# Patient Record
Sex: Female | Born: 1975 | Hispanic: Yes | Marital: Married | Smoking: Never smoker
Health system: Southern US, Community
[De-identification: ages and names within clinical notes are randomized; demographics above are authoritative.]

## PROBLEM LIST (undated history)

## (undated) DIAGNOSIS — G8929 Other chronic pain: Secondary | ICD-10-CM

## (undated) DIAGNOSIS — F329 Major depressive disorder, single episode, unspecified: Secondary | ICD-10-CM

## (undated) DIAGNOSIS — T7421XA Adult sexual abuse, confirmed, initial encounter: Secondary | ICD-10-CM

## (undated) DIAGNOSIS — F4310 Post-traumatic stress disorder, unspecified: Secondary | ICD-10-CM

## (undated) DIAGNOSIS — F32A Depression, unspecified: Secondary | ICD-10-CM

## (undated) DIAGNOSIS — F319 Bipolar disorder, unspecified: Secondary | ICD-10-CM

## (undated) HISTORY — DX: Other chronic pain: G89.29

---

## 2004-01-08 ENCOUNTER — Inpatient Hospital Stay: Payer: Self-pay | Admitting: Psychiatry

## 2004-01-08 ENCOUNTER — Observation Stay: Payer: Self-pay | Admitting: Internal Medicine

## 2004-01-09 ENCOUNTER — Other Ambulatory Visit: Payer: Self-pay

## 2004-12-05 ENCOUNTER — Emergency Department: Payer: Self-pay | Admitting: General Practice

## 2005-01-06 ENCOUNTER — Ambulatory Visit: Payer: Self-pay | Admitting: Nurse Practitioner

## 2006-12-14 ENCOUNTER — Encounter: Payer: Self-pay | Admitting: Family Medicine

## 2006-12-18 ENCOUNTER — Encounter: Payer: Self-pay | Admitting: Family Medicine

## 2010-02-23 ENCOUNTER — Emergency Department: Payer: Self-pay | Admitting: Emergency Medicine

## 2011-04-28 ENCOUNTER — Emergency Department: Payer: Self-pay | Admitting: Emergency Medicine

## 2011-04-28 LAB — BASIC METABOLIC PANEL
Anion Gap: 10 (ref 7–16)
Co2: 25 mmol/L (ref 21–32)
Creatinine: 0.65 mg/dL (ref 0.60–1.30)
EGFR (African American): >60
EGFR (Non-African Amer.): >60
Sodium: 142 mmol/L (ref 136–145)

## 2011-04-28 LAB — CBC
HGB: 12.6 g/dL (ref 12.0–16.0)
MCHC: 33.5 g/dL (ref 32.0–36.0)
Platelet: 280 10*3/uL (ref 150–440)
RBC: 4.1 10*6/uL (ref 3.80–5.20)

## 2011-04-28 LAB — URINALYSIS, COMPLETE
Bacteria: NONE SEEN
Bilirubin,UR: NEGATIVE
Glucose,UR: NEGATIVE mg/dL (ref 0–75)
Hyaline Cast: 1
Leukocyte Esterase: NEGATIVE
Protein: NEGATIVE
RBC,UR: 1 /HPF (ref 0–5)
Squamous Epithelial: 3
WBC UR: 1 /HPF (ref 0–5)

## 2011-04-28 LAB — LITHIUM LEVEL: Lithium: <0.2 mmol/L — ABNORMAL LOW

## 2011-09-13 ENCOUNTER — Emergency Department: Payer: Self-pay | Admitting: Emergency Medicine

## 2011-09-13 LAB — URINALYSIS, COMPLETE
Bacteria: NONE SEEN
Bilirubin,UR: NEGATIVE
Glucose,UR: NEGATIVE mg/dL (ref 0–75)
Ketone: NEGATIVE
Leukocyte Esterase: NEGATIVE
Nitrite: NEGATIVE
Ph: 5 (ref 4.5–8.0)
Protein: NEGATIVE
RBC,UR: 27 /HPF (ref 0–5)
Specific Gravity: 1.016 (ref 1.003–1.030)
Squamous Epithelial: NONE SEEN

## 2011-09-13 LAB — DRUG SCREEN, URINE
Amphetamines, Ur Screen: NEGATIVE (ref ?–1000)
Barbiturates, Ur Screen: NEGATIVE (ref ?–200)
Benzodiazepine, Ur Scrn: NEGATIVE (ref ?–200)
Cocaine Metabolite,Ur ~~LOC~~: NEGATIVE (ref ?–300)
MDMA (Ecstasy)Ur Screen: NEGATIVE (ref ?–500)
Tricyclic, Ur Screen: NEGATIVE (ref ?–1000)

## 2011-09-13 LAB — COMPREHENSIVE METABOLIC PANEL
Albumin: 4.2 g/dL (ref 3.4–5.0)
Alkaline Phosphatase: 130 U/L (ref 50–136)
Anion Gap: 13 (ref 7–16)
Bilirubin,Total: 0.2 mg/dL (ref 0.2–1.0)
Calcium, Total: 8.5 mg/dL (ref 8.5–10.1)
Chloride: 108 mmol/L — ABNORMAL HIGH (ref 98–107)
Co2: 21 mmol/L (ref 21–32)
Creatinine: 0.81 mg/dL (ref 0.60–1.30)
EGFR (African American): >60
EGFR (Non-African Amer.): >60
Potassium: 2.9 mmol/L — ABNORMAL LOW (ref 3.5–5.1)
SGOT(AST): 30 U/L (ref 15–37)
SGPT (ALT): 24 U/L
Total Protein: 8.1 g/dL (ref 6.4–8.2)

## 2011-09-13 LAB — CBC
Platelet: 360 10*3/uL (ref 150–440)
RDW: 12.8 % (ref 11.5–14.5)
WBC: 15.6 10*3/uL — ABNORMAL HIGH (ref 3.6–11.0)

## 2011-09-13 LAB — PREGNANCY, URINE: Pregnancy Test, Urine: NEGATIVE m[IU]/mL

## 2011-09-13 LAB — SALICYLATE LEVEL: Salicylates, Serum: <1.7 mg/dL

## 2011-09-13 LAB — ACETAMINOPHEN LEVEL: Acetaminophen: <2 ug/mL

## 2012-10-19 ENCOUNTER — Inpatient Hospital Stay: Payer: Self-pay | Admitting: Psychiatry

## 2012-10-19 LAB — ETHANOL
Ethanol %: <0.003 % (ref 0.000–0.080)
Ethanol: <3 mg/dL

## 2012-10-19 LAB — DRUG SCREEN, URINE
Amphetamines, Ur Screen: NEGATIVE (ref ?–1000)
Barbiturates, Ur Screen: NEGATIVE (ref ?–200)
Benzodiazepine, Ur Scrn: NEGATIVE (ref ?–200)
Cannabinoid 50 Ng, Ur ~~LOC~~: NEGATIVE (ref ?–50)
Cocaine Metabolite,Ur ~~LOC~~: NEGATIVE (ref ?–300)
MDMA (Ecstasy)Ur Screen: NEGATIVE (ref ?–500)
Phencyclidine (PCP) Ur S: NEGATIVE (ref ?–25)
Tricyclic, Ur Screen: NEGATIVE (ref ?–1000)

## 2012-10-19 LAB — COMPREHENSIVE METABOLIC PANEL
Alkaline Phosphatase: 86 U/L (ref 50–136)
Anion Gap: 7 (ref 7–16)
BUN: 15 mg/dL (ref 7–18)
Chloride: 108 mmol/L — ABNORMAL HIGH (ref 98–107)
EGFR (African American): >60
Glucose: 90 mg/dL (ref 65–99)
Osmolality: 280 (ref 275–301)
SGOT(AST): 23 U/L (ref 15–37)
SGPT (ALT): 22 U/L (ref 12–78)
Sodium: 140 mmol/L (ref 136–145)
Total Protein: 7.5 g/dL (ref 6.4–8.2)

## 2012-10-19 LAB — CBC
MCH: 31.5 pg (ref 26.0–34.0)
MCHC: 34.5 g/dL (ref 32.0–36.0)
MCV: 91 fL (ref 80–100)
Platelet: 351 10*3/uL (ref 150–440)
RBC: 4.7 10*6/uL (ref 3.80–5.20)
RDW: 13.1 % (ref 11.5–14.5)

## 2012-10-19 LAB — URINALYSIS, COMPLETE
Bacteria: NONE SEEN
Bilirubin,UR: NEGATIVE
Glucose,UR: NEGATIVE mg/dL (ref 0–75)
Ketone: NEGATIVE
Nitrite: NEGATIVE
Ph: 5 (ref 4.5–8.0)
Protein: 30

## 2012-10-22 LAB — LITHIUM LEVEL: Lithium: 0.54 mmol/L — ABNORMAL LOW

## 2012-11-24 ENCOUNTER — Emergency Department: Payer: Self-pay | Admitting: Emergency Medicine

## 2012-11-24 LAB — URINALYSIS, COMPLETE
Bilirubin,UR: NEGATIVE
Blood: NEGATIVE
Glucose,UR: NEGATIVE mg/dL (ref 0–75)
Leukocyte Esterase: NEGATIVE
Nitrite: NEGATIVE
Ph: 7 (ref 4.5–8.0)
Protein: NEGATIVE
RBC,UR: 4 /HPF (ref 0–5)
Specific Gravity: 1.023 (ref 1.003–1.030)
Squamous Epithelial: 10
WBC UR: 2 /HPF (ref 0–5)

## 2012-11-24 LAB — PREGNANCY, URINE: Pregnancy Test, Urine: NEGATIVE m[IU]/mL

## 2012-11-24 LAB — COMPREHENSIVE METABOLIC PANEL
Albumin: 4.2 g/dL (ref 3.4–5.0)
Alkaline Phosphatase: 90 U/L (ref 50–136)
Anion Gap: 4 — ABNORMAL LOW (ref 7–16)
Calcium, Total: 9.2 mg/dL (ref 8.5–10.1)
Chloride: 108 mmol/L — ABNORMAL HIGH (ref 98–107)
Creatinine: 0.87 mg/dL (ref 0.60–1.30)
EGFR (African American): >60
EGFR (Non-African Amer.): >60
Glucose: 88 mg/dL (ref 65–99)
SGOT(AST): 26 U/L (ref 15–37)
SGPT (ALT): 19 U/L (ref 12–78)

## 2012-11-24 LAB — CBC
HCT: 38.9 % (ref 35.0–47.0)
HGB: 13.3 g/dL (ref 12.0–16.0)
MCH: 31.3 pg (ref 26.0–34.0)
MCHC: 34.1 g/dL (ref 32.0–36.0)
MCV: 92 fL (ref 80–100)
RBC: 4.24 10*6/uL (ref 3.80–5.20)
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
## 2014-06-08 NOTE — Consult Note (Signed)
Brief Consult Note: Diagnosis: bipolar disorder depressed with psychosis.   Patient was seen by consultant.   Consult note dictated.   Recommend further assessment or treatment.   Orders entered.   Discussed with Attending MD.   Comments: Psychiatry: Full note done. Patient with history of bipolar disorder presents depressed and confused. Off meds. Orders done. Needs admission but no beds for now. Continue treatment in ER for now.  Electronic Signatures: Gonzella Lex (MD)  (Signed 03-Sep-14 16:09)  Authored: Brief Consult Note   Last Updated: 03-Sep-14 16:09 by Gonzella Lex (MD)
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
## 2014-06-08 NOTE — Consult Note (Signed)
PATIENT NAME:  Kelly Ballard, SHELLER MR#:  [id number] DATE OF BIRTH:  31-May-1975  DATE OF CONSULTATION:  10/19/2012  CONSULTING PHYSICIAN:  Gonzella Lex, MD  IDENTIFYING INFORMATION AND REASON FOR CONSULT: A 39 year old woman with a history of mental illness who presented to the Emergency Room stating that she was crying and feeling frightened. Consultation for appropriate psychiatric disposition.   HISTORY OF PRESENT ILLNESS: Information obtained from the patient with help from [place] language interpreter. The patient states that she woke up this morning and she was crying hard. She is not sure why. She also says that she has been feeling bad for several months. She has been having trouble sleeping at night and feels afraid to sleep. She feels like she wishes that she were dead, although she does not have any intention of trying to kill herself. She is back and forth on reporting whether she is having auditory hallucinations. She is not currently on any of her medication. She denies that she has been abusing substances. She is vague about whether there have been any particular new stressors in her life.   PAST PSYCHIATRIC HISTORY: The patient evidently has a long history of bipolar disorder and has had multiple presentations to hospitals both in [place] and here in the [place]. She was an inpatient in our hospital in 2005. At that time was diagnosed with bipolar disorder mixed. Since then, she has been evaluated in the Emergency Room psychiatrically on at least 2 other occasions. The patient denies that she has ever actually tried to kill herself. Denies being violent. There is a past history of her being aggressive and agitated at least when she is psychotic. She has been on multiple medicines in the past and she is a confusing historian, but she says that she thinks that when she took lithium, she did well. She said that she could not continue her medicine in part because she could not  afford it. Also because of the Risperdal that she was taking at one time increased her prolactin level.   SUBSTANCE ABUSE HISTORY: Denies that she drinks or abuses drugs. There does not appear to be a history of substance abuse problems.   PAST MEDICAL HISTORY: Denies having any significant medical problems. Denies high blood pressure or diabetes. She cannot remember when her last menstrual cycle was.   SOCIAL HISTORY: She can just tell us that right now she is living with some man who is a friend. She does not give [place] any more detail than that. Her cousin brought her over here to the hospital today. The patient has been living here in [place] at least on and off for years. Her English remains poor. She denies that she has any children. She says that she has been working recently at a [employer].   CURRENT MEDICATIONS: None.   ALLERGIES: No known drug allergies.   FAMILY HISTORY: She says that she had a grandfather who committed suicide and was crazy.   MENTAL STATUS EXAMINATION: The patient was curled up on a stretcher in the Emergency Room. She responded during the interview but was very passive. She made no eye contact, at all. Psychomotor activity very limited. The patient was tearful throughout. Kept her eyes clamped shut for the most part. She spoke only in partial sentences for that most part. Affect depressed. Mood stated as bad. Thoughts a little disorganized, but not grossly bizarre. Not clear if she is having any delusions. She reports  feeling frightened that someone was going to hurt here.  It is not clear if this is reality-based. Possibly having auditory hallucinations. Denies suicidal ideation, but says she is afraid she is going to die and also that she wants to die. Denies homicidal ideation. Judgment and insight poor. Cognition currently impaired. Alert and oriented.   LABORATORY RESULTS: Chemistry panel just shows a slightly elevated chloride of no  significance. Alcohol negative. TSH normal. Drug screen negative. The CBC is normal. Urinalysis a little bit of blood but otherwise not very remarkable. Acetaminophen and salicylates negative. No pregnancy test has been done yet.   ASSESSMENT: A 39 year old woman with bipolar disorder who has been off of all treatment. Presents to the hospital, depressed, tearful, disorganized. Probably having psychotic symptoms. Needs hospital level treatment.   TREATMENT PLAN: Currently, we have no beds. I explained this to the patient. I am going to start her on lithium and Navane  right now. Choice of Navane because of her financial problems. We will try to move her to a more quiet, area when one is available. Meanwhile, we will continue to evaluate her and if we have a bed come open, probably admit her tomorrow.   DIAGNOSIS, PRINCIPAL AND PRIMARY:  AXIS I: Bipolar disorder, depressed, with psychotic features.   SECONDARY DIAGNOSES: AXIS I: No further.  AXIS II: Deferred.  AXIS III: No diagnosis.  AXIS IV: Moderate from chronic illness.   AXIS V:  Functioning at time of evaluation 30.     ____________________________ Gonzella Lex, MD jtc:dp D: 10/19/2012 16:17:39 ET T: 10/19/2012 16:25:02 ET JOB#: [id number]  cc: Gonzella Lex, MD, <Dictator> Gonzella Lex MD ELECTRONICALLY SIGNED 10/19/2012 17:57

## 2015-05-13 DIAGNOSIS — N926 Irregular menstruation, unspecified: Secondary | ICD-10-CM | POA: Insufficient documentation

## 2015-05-13 DIAGNOSIS — N970 Female infertility associated with anovulation: Secondary | ICD-10-CM | POA: Insufficient documentation

## 2015-07-01 DIAGNOSIS — E282 Polycystic ovarian syndrome: Secondary | ICD-10-CM | POA: Insufficient documentation

## 2016-11-27 ENCOUNTER — Encounter: Payer: Self-pay | Admitting: Emergency Medicine

## 2016-11-27 ENCOUNTER — Emergency Department
Admission: EM | Admit: 2016-11-27 | Discharge: 2016-11-27 | Disposition: A | Discharge status: Home / Self Care | Payer: BLUE CROSS/BLUE SHIELD | Attending: Emergency Medicine | Admitting: Emergency Medicine

## 2016-11-27 ENCOUNTER — Emergency Department: Payer: BLUE CROSS/BLUE SHIELD

## 2016-11-27 DIAGNOSIS — R1030 Lower abdominal pain, unspecified: Secondary | ICD-10-CM | POA: Insufficient documentation

## 2016-11-27 DIAGNOSIS — R109 Unspecified abdominal pain: Secondary | ICD-10-CM

## 2016-11-27 HISTORY — DX: Bipolar disorder, unspecified: F31.9

## 2016-11-27 LAB — URINALYSIS, COMPLETE (UACMP) WITH MICROSCOPIC
BACTERIA UA: NONE SEEN
BILIRUBIN URINE: NEGATIVE
Glucose, UA: NEGATIVE mg/dL
Ketones, ur: NEGATIVE mg/dL
LEUKOCYTES UA: NEGATIVE
NITRITE: NEGATIVE
PH: 5 (ref 5.0–8.0)
Protein, ur: NEGATIVE mg/dL
SPECIFIC GRAVITY, URINE: 1.019 (ref 1.005–1.030)

## 2016-11-27 LAB — COMPREHENSIVE METABOLIC PANEL
ALBUMIN: 4.3 g/dL (ref 3.5–5.0)
ALK PHOS: 110 U/L (ref 38–126)
ALT: 75 U/L — AB (ref 14–54)
AST: 69 U/L — AB (ref 15–41)
Anion gap: 8 (ref 5–15)
BILIRUBIN TOTAL: 0.5 mg/dL (ref 0.3–1.2)
BUN: 18 mg/dL (ref 6–20)
CO2: 24 mmol/L (ref 22–32)
CREATININE: 0.61 mg/dL (ref 0.44–1.00)
Calcium: 9 mg/dL (ref 8.9–10.3)
Chloride: 106 mmol/L (ref 101–111)
GFR calc Af Amer: >60 mL/min (ref >60)
GLUCOSE: 83 mg/dL (ref 65–99)
Potassium: 4.4 mmol/L (ref 3.5–5.1)
Sodium: 138 mmol/L (ref 135–145)
TOTAL PROTEIN: 8 g/dL (ref 6.5–8.1)

## 2016-11-27 LAB — CBC
HEMATOCRIT: 42.2 % (ref 35.0–47.0)
Hemoglobin: 14.6 g/dL (ref 12.0–16.0)
MCH: 31.2 pg (ref 26.0–34.0)
MCHC: 34.5 g/dL (ref 32.0–36.0)
MCV: 90.5 fL (ref 80.0–100.0)
PLATELETS: 329 10*3/uL (ref 150–440)
RBC: 4.67 MIL/uL (ref 3.80–5.20)
RDW: 13 % (ref 11.5–14.5)
WBC: 9.1 10*3/uL (ref 3.6–11.0)

## 2016-11-27 LAB — LIPASE, BLOOD: Lipase: 36 U/L (ref 11–51)

## 2016-11-27 LAB — PREGNANCY, URINE: PREG TEST UR: NEGATIVE

## 2016-11-27 MED ORDER — DICYCLOMINE HCL 20 MG PO TABS: 20.0000 mg | ORAL_TABLET | Freq: Three times a day (TID) | ORAL | 0 refills | Status: DC | PRN

## 2016-11-27 NOTE — ED Provider Notes (Signed)
[facility] Emergency Department Provider Note   ____________________________________________   I have reviewed the triage vital signs and the nursing notes.   HISTORY  Chief Complaint Abdominal Pain   History limited by: Not Limited   HPI Kelly Ballard is a 41 y.o. female who presents to the emergency department today because of abdominal pain.  LOCATION:lower abdominal pain DURATION:10 days TIMING: fairly constant SEVERITY: 5/10 CONTEXT: Patient states that her periods have been slightly irregular. Feels like she is about to start a period but then it did not happen.  MODIFYING FACTORS: slightly better in the morning ASSOCIATED SYMPTOMS: denies any fevers, no change in urination or defecation  Per medical record review patient has a history of PCOS  Past Medical History:  Diagnosis Date  . Bipolar 1 disorder ([place])     There are no active problems to display for this patient.   History reviewed. No pertinent surgical history.  Prior to Admission medications   Not on File    Allergies Patient has no known allergies.  No family history on file.  Social History Social History  Substance Use Topics  . Smoking status: Never Smoker  . Smokeless tobacco: Never Used  . Alcohol use No    Review of Systems Constitutional: No fever/chills Eyes: No visual changes. ENT: No sore throat. Cardiovascular: Denies chest pain. Respiratory: Denies shortness of breath. Gastrointestinal: Positive for lower abdominal pain. Genitourinary: Negative for dysuria. Musculoskeletal: Negative for back pain. Skin: Negative for rash. Neurological: Negative for headaches, focal weakness or numbness.  ____________________________________________   PHYSICAL EXAM:  VITAL SIGNS: ED Triage Vitals  Enc Vitals Group     BP 11/27/16 1733 110/68     Pulse Rate 11/27/16 1733 (!) 50     Resp 11/27/16 1733 16     Temp 11/27/16 1733 98.2 F (36.8  C)     Temp Source 11/27/16 1733 Oral     SpO2 11/27/16 1733 100 %     Weight 11/27/16 1733 128 lb (58.1 kg)     Height --      Head Circumference --      Peak Flow --      Pain Score 11/27/16 1731 5   Constitutional: Alert and oriented. Well appearing and in no distress. Eyes: Conjunctivae are normal.  ENT   Head: Normocephalic and atraumatic.   Nose: No congestion/rhinnorhea.   Mouth/Throat: Mucous membranes are moist.   Neck: No stridor. Hematological/Lymphatic/Immunilogical: No cervical lymphadenopathy. Cardiovascular: Normal rate, regular rhythm.  No murmurs, rubs, or gallops.  Respiratory: Normal respiratory effort without tachypnea nor retractions. Breath sounds are clear and equal bilaterally. No wheezes/rales/rhonchi. Gastrointestinal: Soft and minimally tender in the suprapubic and lower quadrants. No rebound. No guarding.  Genitourinary: Deferred Musculoskeletal: Normal range of motion in all extremities. No lower extremity edema. Neurologic:  Normal speech and language. No gross focal neurologic deficits are appreciated.  Skin:  Skin is warm, dry and intact. No rash noted. Psychiatric: Mood and affect are normal. Speech and behavior are normal. Patient exhibits appropriate insight and judgment.  ____________________________________________    LABS (pertinent positives/negatives)  Lipase 36 CBC wnl CMP wnl save for AST and ALT UA Mod urine dipstick ____________________________________________   EKG  None  ____________________________________________    RADIOLOGY  US pelvis Normal  ___________________________________________   PROCEDURES  Procedures  ____________________________________________   INITIAL IMPRESSION / ASSESSMENT AND PLAN / ED COURSE  Pertinent labs & imaging results that were available during my care  of the patient were reviewed by me and considered in my medical decision making (see chart for details).  Patient  presented to the emergency department. Differential diagnosis includes, but is not limited to, ovarian cyst, ovarian torsion, acute appendicitis, diverticulitis, urinary tract infection/pyelonephritis, endometriosis, bowel obstruction, colitis, renal colic, gastroenteritis, hernia, pregnancy related pain including ectopic pregnancy, etc. Patient work up without any concerning findings. At this point think possible IBS type pathology likely. Given lack of fever, leukocytosis think it reasonable to defer further imaging. Discussed return precautions with patient and husband.    ____________________________________________   FINAL CLINICAL IMPRESSION(S) / ED DIAGNOSES  Final diagnoses:  Abdominal pain  Abdominal pain     Note: This dictation was prepared with Dragon dictation. Any transcriptional errors that result from this process are unintentional     Nance Pear, MD 11/27/16 2312

## 2016-11-27 NOTE — ED Triage Notes (Signed)
Pt sent to ED from [facility] for bilateral lower abdominal pain and back pain. Pts husband states that pt had a urine test done that showed trace amounts of blood in the urine and she had an abdominal x-ray that showed a large amount of stool and an air pocket. Pt denies any other symptoms. Pt in NAD at this time.

## 2016-11-27 NOTE — ED Notes (Signed)
RN called lab to inform of add on preg test.

## 2016-11-27 NOTE — ED Notes (Signed)
Pt in NAD at time of discharge. Pt verbalized understanding of home treatment and follow up as needed. Pt ambulatory at time of discharge without assistance.

## 2016-11-27 NOTE — Discharge Instructions (Signed)
Please seek medical attention for any high fevers, chest pain, shortness of breath, change in behavior, persistent vomiting, bloody stool or any other new or concerning symptoms.  

## 2016-12-04 ENCOUNTER — Emergency Department: Payer: BLUE CROSS/BLUE SHIELD

## 2016-12-04 ENCOUNTER — Emergency Department
Admission: EM | Admit: 2016-12-04 | Discharge: 2016-12-04 | Disposition: A | Discharge status: Home / Self Care | Payer: BLUE CROSS/BLUE SHIELD | Attending: Emergency Medicine | Admitting: Emergency Medicine

## 2016-12-04 DIAGNOSIS — R1030 Lower abdominal pain, unspecified: Secondary | ICD-10-CM | POA: Diagnosis not present

## 2016-12-04 LAB — URINALYSIS, COMPLETE (UACMP) WITH MICROSCOPIC
BILIRUBIN URINE: NEGATIVE
Glucose, UA: NEGATIVE mg/dL
KETONES UR: 20 mg/dL — AB
LEUKOCYTES UA: NEGATIVE
NITRITE: NEGATIVE
PH: 5 (ref 5.0–8.0)
Protein, ur: 30 mg/dL — AB
SPECIFIC GRAVITY, URINE: 1.028 (ref 1.005–1.030)

## 2016-12-04 LAB — CBC
HEMATOCRIT: 39.8 % (ref 35.0–47.0)
Hemoglobin: 13.7 g/dL (ref 12.0–16.0)
MCH: 31.3 pg (ref 26.0–34.0)
MCHC: 34.4 g/dL (ref 32.0–36.0)
MCV: 91 fL (ref 80.0–100.0)
PLATELETS: 321 10*3/uL (ref 150–440)
RBC: 4.37 MIL/uL (ref 3.80–5.20)
RDW: 13 % (ref 11.5–14.5)
WBC: 18.1 10*3/uL — AB (ref 3.6–11.0)

## 2016-12-04 LAB — COMPREHENSIVE METABOLIC PANEL
ALBUMIN: 4.5 g/dL (ref 3.5–5.0)
ALT: 25 U/L (ref 14–54)
AST: 25 U/L (ref 15–41)
Alkaline Phosphatase: 100 U/L (ref 38–126)
Anion gap: 12 (ref 5–15)
BILIRUBIN TOTAL: 0.7 mg/dL (ref 0.3–1.2)
BUN: 22 mg/dL — ABNORMAL HIGH (ref 6–20)
CHLORIDE: 106 mmol/L (ref 101–111)
CO2: 21 mmol/L — ABNORMAL LOW (ref 22–32)
Calcium: 8.9 mg/dL (ref 8.9–10.3)
Creatinine, Ser: 0.74 mg/dL (ref 0.44–1.00)
Glucose, Bld: 156 mg/dL — ABNORMAL HIGH (ref 65–99)
POTASSIUM: 3.5 mmol/L (ref 3.5–5.1)
Sodium: 139 mmol/L (ref 135–145)
TOTAL PROTEIN: 7.8 g/dL (ref 6.5–8.1)

## 2016-12-04 LAB — LIPASE, BLOOD: Lipase: 24 U/L (ref 11–51)

## 2016-12-04 LAB — POCT PREGNANCY, URINE: Preg Test, Ur: NEGATIVE

## 2016-12-04 MED ORDER — IOPAMIDOL (ISOVUE-300) INJECTION 61%
30.0000 mL | Freq: Once | INTRAVENOUS | Status: AC
  Administered 2016-12-04: 30 mL via ORAL

## 2016-12-04 MED ORDER — FENTANYL CITRATE (PF) 100 MCG/2ML IJ SOLN
INTRAMUSCULAR | Status: AC
  Filled 2016-12-04: qty 2

## 2016-12-04 MED ORDER — ONDANSETRON 4 MG PO TBDP
ORAL_TABLET | ORAL | Status: AC
  Administered 2016-12-04: 4 mg via ORAL
  Filled 2016-12-04: qty 1

## 2016-12-04 MED ORDER — FENTANYL CITRATE (PF) 100 MCG/2ML IJ SOLN
50.0000 ug | INTRAMUSCULAR | Status: DC | PRN
  Administered 2016-12-04: 50 ug via NASAL

## 2016-12-04 MED ORDER — ONDANSETRON 4 MG PO TBDP
4.0000 mg | ORAL_TABLET | Freq: Once | ORAL | Status: DC | PRN
  Administered 2016-12-04: 4 mg via ORAL

## 2016-12-04 MED ORDER — TRAMADOL HCL 50 MG PO TABS: 50.0000 mg | ORAL_TABLET | Freq: Four times a day (QID) | ORAL | 0 refills | Status: AC | PRN

## 2016-12-04 MED ORDER — HYDROMORPHONE HCL 1 MG/ML IJ SOLN
1.0000 mg | Freq: Once | INTRAMUSCULAR | Status: AC
  Administered 2016-12-04: 1 mg via INTRAVENOUS
  Filled 2016-12-04: qty 1

## 2016-12-04 MED ORDER — IOPAMIDOL (ISOVUE-300) INJECTION 61%
100.0000 mL | Freq: Once | INTRAVENOUS | Status: AC | PRN
  Administered 2016-12-04: 100 mL via INTRAVENOUS

## 2016-12-04 MED ORDER — ONDANSETRON HCL 4 MG/2ML IJ SOLN
4.0000 mg | Freq: Once | INTRAMUSCULAR | Status: AC
  Administered 2016-12-04: 4 mg via INTRAVENOUS
  Filled 2016-12-04: qty 2

## 2016-12-04 NOTE — ED Notes (Signed)
Patient given brown socks and given instructions about how to give a urine sample. Patient's husband at bedside.

## 2016-12-04 NOTE — ED Provider Notes (Signed)
[facility] Emergency Department Provider Note   ____________________________________________    I have reviewed the triage vital signs and the nursing notes.   HISTORY  Chief Complaint Abdominal Pain   Spanish interpreter used  HPI Kelly Ballard is a 41 y.o. female who presents with lower abdominal pain.  Family reports the patient has had intermittent abdominal pain over the last month, was seen in the emergency department last week for this abdominal pain but no etiology was discovered.  Apparently became much worse today over the last 3 hours.  The patient has been screaming in pain for the last hour.  The pain is apparently very sharp and in the bilateral lower abdomen.  No vomiting reported.  No fevers reported.  Patient is unable to provide significant history  Past Medical History:  Diagnosis Date  . Bipolar 1 disorder ([place])     There are no active problems to display for this patient.   History reviewed. No pertinent surgical history.  Prior to Admission medications   Medication Sig Start Date End Date Taking? Authorizing Provider  dicyclomine (BENTYL) 20 MG tablet Take 1 tablet (20 mg total) by mouth 3 (three) times daily as needed (abdominal pain). 11/27/16   Nance Pear, MD     Allergies Nyquil multi-symptom [[id number]-doxylamine-dm-apap]  No family history on file.  Social History Social History  Substance Use Topics  . Smoking status: Never Smoker  . Smokeless tobacco: Never Used  . Alcohol use No    Review of Systems  Constitutional: No fevers reported Eyes: No discharge ENT: No sore throat. Cardiovascular: Denies chest pain. Respiratory: Denies shortness of breath. Gastrointestinal: As above Genitourinary: Negative for dysuria.  No vaginal discharge Musculoskeletal: Negative for back pain. Skin: Negative for rash. Neurological: Negative for  headaches   ____________________________________________   PHYSICAL EXAM:  VITAL SIGNS: ED Triage Vitals  Enc Vitals Group     BP 12/04/16 2124 107/64     Pulse Rate 12/04/16 2124 76     Resp 12/04/16 2124 14     Temp --      Temp src --      SpO2 12/04/16 2124 94 %     Weight 12/04/16 2057 58.1 kg (128 lb)     Height 12/04/16 2057 1.473 m (4\' 10" )     Head Circumference --      Peak Flow --      Pain Score 12/04/16 2056 10     Pain Loc --      Pain Edu? --      Excl. in [place]? --     Constitutional: Patient is literally screaming in pain Eyes: Conjunctivae are normal.   Nose: No congestion/rhinnorhea. Mouth/Throat: Mucous membranes are moist.    Cardiovascular: Normal rate, regular rhythm. Grossly normal heart sounds.  Good peripheral circulation. Respiratory: Normal respiratory effort.  No retractions. Lungs CTAB. Gastrointestinal: Minimal tenderness to palpation left lower quadrant and right lower quadrant. No distention.  No CVA tenderness. Genitourinary: deferred Musculoskeletal: No lower extremity tenderness nor edema.  Warm and well perfused Neurologic:  Normal speech and language. No gross focal neurologic deficits are appreciated.  Skin:  Skin is warm, dry and intact. No rash noted. Psychiatric: Highly anxious  ____________________________________________   LABS (all labs ordered are listed, but only abnormal results are displayed)  Labs Reviewed  CBC - Abnormal; Notable for the following:       Result Value   WBC 18.1 (*)  All other components within normal limits  LIPASE, BLOOD  COMPREHENSIVE METABOLIC PANEL  URINALYSIS, COMPLETE (UACMP) WITH MICROSCOPIC   ____________________________________________  EKG  None ____________________________________________  RADIOLOGY  CT abdomen and pelvis unremarkable ____________________________________________   PROCEDURES  Procedure(s) performed: No    Critical Care performed:  No ____________________________________________   INITIAL IMPRESSION / ASSESSMENT AND PLAN / ED COURSE  Pertinent labs & imaging results that were available during my care of the patient were reviewed by me and considered in my medical decision making (see chart for details).  As described above patient is screaming in pain.  There appears to be a strong component of anxiety to this however the patient does have mild tenderness to palpation.  1 mg of IV Dilaudid given with significant improvement in her pain.  Will obtain CT abdomen pelvis.  Differential diagnosis includes appendicitis, diverticulitis, kidney stone, UTI/pyelonephritis  Medical records reviewed from last visit, ultrasound unremarkable  ----------------------------------------- 11:10 PM on 12/04/2016 -----------------------------------------  CT abdomen pelvis unremarkable.  Patient is pain-free and well-appearing.  Possibly could be related to endometriosis, will refer her to gynecology for further workup    ____________________________________________   FINAL CLINICAL IMPRESSION(S) / ED DIAGNOSES  Final diagnoses:  Lower abdominal pain      NEW MEDICATIONS STARTED DURING THIS VISIT:  New Prescriptions   No medications on file     Note:  This document was prepared using Dragon voice recognition software and may include unintentional dictation errors.    Lavonia Drafts, MD 12/04/16 [phone]

## 2016-12-04 NOTE — ED Notes (Signed)
Patient at CT scan.

## 2016-12-04 NOTE — ED Triage Notes (Signed)
Patient c/o severe abominal pain X 2 weeks with increasing severity X 4 hours. Patient c/o vomiting and diarrhea. Patient screaming/crying in triage, unable to hold still.

## 2016-12-04 NOTE — ED Notes (Signed)

## 2016-12-04 NOTE — ED Notes (Signed)
Patient had large emesis in triage 

## 2016-12-09 ENCOUNTER — Ambulatory Visit (INDEPENDENT_AMBULATORY_CARE_PROVIDER_SITE_OTHER): Payer: BLUE CROSS/BLUE SHIELD | Admitting: Obstetrics and Gynecology

## 2016-12-09 ENCOUNTER — Encounter: Payer: Self-pay | Admitting: Obstetrics and Gynecology

## 2016-12-09 VITALS — BP 124/70 | Wt 128.0 lb

## 2016-12-09 DIAGNOSIS — R1030 Lower abdominal pain, unspecified: Secondary | ICD-10-CM

## 2016-12-09 NOTE — Progress Notes (Signed)
Obstetrics & Gynecology Office Visit   Chief Complaint  Patient presents with  . Follow-up    ER Follow Up  Pelvic pain  History of Present Illness: 41 y.o. G0P0000 female who has seen me in the past for fertility issues.  She was referred by me due to age to [facility] for faster treatment options.  She was seen in the ER on 10/19 due to acute-onset bilateral lower abdominal pain.  She woke from sleep with pain, was taken by her husband to the ER, and was evaluated and treated in the ER with pain medication. Since the ER she has been taking tramadol and the pain is under better control.  In discussing her history of pain, she states that over the past several years she has had an increasing number of episodes of severe pain.  Her first incident was 12 years ago, then six years after that.  Her last episode was twelve months ago. Her workup in the ER was negative. She is not currently undergoing assisted reproductive treatment by [facility] or anyone else.  She states that she has noticed that this pain can occur when her menses come irregularly. She states that her last menses was in August.  This seems to be the pattern for her.  She has no acute issues today.    Past Medical History:  Diagnosis Date  . Bipolar 1 disorder ([place])     History reviewed. No pertinent surgical history.  Gynecologic History: Patient's last menstrual period was 12/04/2016.  Obstetric History: G0P0000  History reviewed. No pertinent family history.  Social History   Social History  . Marital status: Married    Spouse name: N/A  . Number of children: N/A  . Years of education: N/A   Occupational History  . Not on file.   Social History Main Topics  . Smoking status: Never Smoker  . Smokeless tobacco: Never Used  . Alcohol use No  . Drug use: No  . Sexual activity: Not on file   Other Topics Concern  . Not on file   Social History Narrative  . No narrative on file    Allergies  Allergen Reactions    . Nyquil Multi-Symptom [Pseudoeph-Doxylamine-Dm-Apap]     Prior to Admission medications   Medication Sig Start Date End Date Taking? Authorizing Provider  traMADol (ULTRAM) 50 MG tablet Take 1 tablet (50 mg total) by mouth every 6 (six) hours as needed. 12/04/16 12/04/17 Yes Lavonia Drafts, MD  dicyclomine (BENTYL) 20 MG tablet Take 1 tablet (20 mg total) by mouth 3 (three) times daily as needed (abdominal pain). Patient not taking: Reported on 12/09/2016 11/27/16   Nance Pear, MD    Review of Systems  Constitutional: Negative.   HENT: Negative.   Eyes: Negative.   Respiratory: Negative.   Cardiovascular: Negative.   Gastrointestinal: Positive for abdominal pain (mild, bilateral lower quadrants).  Genitourinary: Negative.   Musculoskeletal: Negative.   Skin: Negative.   Neurological: Negative.   Psychiatric/Behavioral: Negative.      Physical Exam BP 124/70   Wt 128 lb (58.1 kg)   LMP 12/04/2016   BMI 26.75 kg/m  Patient's last menstrual period was 12/04/2016. Physical Exam  Constitutional: She is oriented to person, place, and time. She appears well-developed and well-nourished. No distress.  HENT:  Head: Normocephalic and atraumatic.  Eyes: EOM are normal. No scleral icterus.  Neck: Normal range of motion. Neck supple.  Cardiovascular: Normal rate and regular rhythm.   Pulmonary/Chest: Effort normal  and breath sounds normal. No respiratory distress. She has no wheezes. She has no rales.  Abdominal: Soft. Bowel sounds are normal. She exhibits no distension and no mass. There is no tenderness. There is no rebound and no guarding.  Musculoskeletal: Normal range of motion. She exhibits no edema.  Neurological: She is alert and oriented to person, place, and time. No cranial nerve deficit.  Skin: Skin is warm and dry. No erythema.  Psychiatric: She has a normal mood and affect. Her behavior is normal. Judgment normal.    Female chaperone present for pelvic and  breast  portions of the physical exam  Assessment: 41 y.o. G0P0000 female here for  1. Lower abdominal pain      Plan: Problem List Items Addressed This Visit    Abdominal pain - Primary   Relevant Medications   medroxyPROGESTERone (PROVERA) 10 MG tablet    Based on the symptoms described by the patient, it is possible that her pain occurs when she goes for extended periods of time without a menses.  However, the treatment for this would be something hormonal.  She and her husband continue to attempt pregnancy even though she is no longer seeing [facility].  Discussed several strategies for having her have regular menstruation.  Decision made to provoke a menstruation if the patient goes 43 days and has not started a new menstruation.  I instructed her specifically to wait until she got day #35 of her cycle.  At that point, if no menstruation, she should take a pregnancy test.  If the pregnancy test is negative, she should take Provera 10 mg by mouth each day for 10 days to provoke her menstruation.  In this way, her menses can be more frequent and this should not interfere with her ability to become pregnant. I have personally reviewed all the notes from the emergency room and the imaging accomplished along with the lab results.  All questions answered for both the patient and her husband.  We will see the patient back in about 6 months to assess her frequency of menstruation and her pain.  Prentice Docker, MD 12/11/2016 12:14 PM

## 2016-12-11 DIAGNOSIS — R109 Unspecified abdominal pain: Secondary | ICD-10-CM | POA: Insufficient documentation

## 2016-12-11 MED ORDER — MEDROXYPROGESTERONE ACETATE 10 MG PO TABS: 10.0000 mg | ORAL_TABLET | Freq: Every day | ORAL | 5 refills | Status: DC

## 2017-01-05 ENCOUNTER — Ambulatory Visit: Payer: BLUE CROSS/BLUE SHIELD | Admitting: Obstetrics and Gynecology

## 2017-10-11 ENCOUNTER — Other Ambulatory Visit: Payer: Self-pay

## 2017-10-11 ENCOUNTER — Encounter: Payer: Self-pay | Admitting: Emergency Medicine

## 2017-10-11 ENCOUNTER — Emergency Department
Admission: EM | Admit: 2017-10-11 | Discharge: 2017-10-12 | Disposition: A | Discharge status: Home / Self Care | Payer: BLUE CROSS/BLUE SHIELD | Attending: Emergency Medicine | Admitting: Emergency Medicine

## 2017-10-11 DIAGNOSIS — F332 Major depressive disorder, recurrent severe without psychotic features: Secondary | ICD-10-CM | POA: Insufficient documentation

## 2017-10-11 HISTORY — DX: Depression, unspecified: F32.A

## 2017-10-11 HISTORY — DX: Major depressive disorder, single episode, unspecified: F32.9

## 2017-10-11 HISTORY — DX: Post-traumatic stress disorder, unspecified: F43.10

## 2017-10-11 HISTORY — DX: Adult sexual abuse, confirmed, initial encounter: T74.21XA

## 2017-10-11 LAB — CBC
HCT: 41.1 % (ref 35.0–47.0)
Hemoglobin: 14.6 g/dL (ref 12.0–16.0)
MCH: 32.4 pg (ref 26.0–34.0)
MCHC: 35.5 g/dL (ref 32.0–36.0)
MCV: 91.3 fL (ref 80.0–100.0)
Platelets: 322 10*3/uL (ref 150–440)
RBC: 4.5 MIL/uL (ref 3.80–5.20)
RDW: 12.9 % (ref 11.5–14.5)
WBC: 12.5 10*3/uL — AB (ref 3.6–11.0)

## 2017-10-11 LAB — URINE DRUG SCREEN, QUALITATIVE (ARMC ONLY)
AMPHETAMINES, UR SCREEN: NOT DETECTED
Barbiturates, Ur Screen: NOT DETECTED
Cannabinoid 50 Ng, Ur ~~LOC~~: NOT DETECTED
Cocaine Metabolite,Ur ~~LOC~~: NOT DETECTED
MDMA (ECSTASY) UR SCREEN: NOT DETECTED
METHADONE SCREEN, URINE: NOT DETECTED
OPIATE, UR SCREEN: NOT DETECTED
Phencyclidine (PCP) Ur S: NOT DETECTED
Tricyclic, Ur Screen: NOT DETECTED

## 2017-10-11 LAB — POCT PREGNANCY, URINE: PREG TEST UR: NEGATIVE

## 2017-10-11 LAB — COMPREHENSIVE METABOLIC PANEL
ALT: 64 U/L — ABNORMAL HIGH (ref 0–44)
AST: 52 U/L — ABNORMAL HIGH (ref 15–41)
Albumin: 4.4 g/dL (ref 3.5–5.0)
Alkaline Phosphatase: 109 U/L (ref 38–126)
Anion gap: 5 (ref 5–15)
BILIRUBIN TOTAL: 0.4 mg/dL (ref 0.3–1.2)
BUN: 22 mg/dL — ABNORMAL HIGH (ref 6–20)
CALCIUM: 8.8 mg/dL — AB (ref 8.9–10.3)
CO2: 22 mmol/L (ref 22–32)
Chloride: 110 mmol/L (ref 98–111)
Creatinine, Ser: 0.82 mg/dL (ref 0.44–1.00)
Glucose, Bld: 104 mg/dL — ABNORMAL HIGH (ref 70–99)
Potassium: 3.9 mmol/L (ref 3.5–5.1)
Sodium: 137 mmol/L (ref 135–145)
TOTAL PROTEIN: 7.7 g/dL (ref 6.5–8.1)

## 2017-10-11 LAB — ACETAMINOPHEN LEVEL

## 2017-10-11 LAB — ETHANOL

## 2017-10-11 LAB — SALICYLATE LEVEL
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
## 2017-10-11 NOTE — ED Notes (Signed)
Pt offered meal tray and refused. Pt did request water only. Pt resting comfortably in bed. No other needs voiced at this time. Will continue to monitor Q15 minute rounds.

## 2017-10-11 NOTE — ED Notes (Signed)
Husband went home. Main ED number given and password [facility]
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
## 2017-10-11 NOTE — ED Notes (Signed)
Telepsych Dr Gerrit Friends called in for report

## 2017-10-11 NOTE — ED Notes (Addendum)
Pt changed into scrubs and belongings secured. Black scrub top and pants, bra, panties, black socks, black tennis shoes. Secured hair tie, pearl earrings and silver wedding ring. Giving pt belongings to husband. Husband Jacqulyn Bath [place] [phone])

## 2017-10-12 MED ORDER — ESCITALOPRAM OXALATE 10 MG PO TABS: 10.0000 mg | ORAL_TABLET | Freq: Every day | ORAL | 0 refills | Status: DC

## 2017-10-12 NOTE — ED Notes (Signed)
Husband coming to pick up pt. Pt understands discharge instructions

## 2017-10-12 NOTE — Discharge Instructions (Signed)
Please follow up with [facility] for further psychiatric services.

## 2017-10-12 NOTE — ED Provider Notes (Signed)
-----------------------------------------   12:22 AM on 10/12/2017 -----------------------------------------   Blood pressure (!) 154/108, pulse 92, temperature 98.8 F (37.1 C), temperature source Oral, resp. rate 20, height 4\' 10"  (1.473 m), weight 61.2 kg, last menstrual period 09/12/2017, SpO2 100 %.  Assuming care from Dr. Alfred Levins.  In short, Kelly Ballard is a 42 y.o. female with a chief complaint of Medical Clearance .  Refer to the original H&P for additional details.  The current plan of care is to follow up the recommendations of the tele-psychiatrist.  The patient was seen by the tele-psychiatrist and the recommendation was to discharge the patient home with support.  They want her to be referred to an outpatient psychiatrist and to be prescribed Lexapro 10 mg every morning for depression.  The patient will be discharged and referred to [place].        Loney Hering, MD 10/12/17 [phone]

## 2018-08-09 ENCOUNTER — Other Ambulatory Visit: Payer: Self-pay

## 2018-08-09 ENCOUNTER — Emergency Department
Admission: EM | Admit: 2018-08-09 | Discharge: 2018-08-09 | Disposition: A | Discharge status: Home / Self Care | Payer: Managed Care, Other (non HMO) | Attending: Emergency Medicine | Admitting: Emergency Medicine

## 2018-08-09 ENCOUNTER — Encounter: Payer: Self-pay | Admitting: Emergency Medicine

## 2018-08-09 ENCOUNTER — Emergency Department: Payer: Managed Care, Other (non HMO)

## 2018-08-09 DIAGNOSIS — Z20828 Contact with and (suspected) exposure to other viral communicable diseases: Secondary | ICD-10-CM | POA: Insufficient documentation

## 2018-08-09 DIAGNOSIS — R52 Pain, unspecified: Secondary | ICD-10-CM | POA: Diagnosis present

## 2018-08-09 DIAGNOSIS — M7918 Myalgia, other site: Secondary | ICD-10-CM | POA: Insufficient documentation

## 2018-08-09 DIAGNOSIS — M791 Myalgia, unspecified site: Secondary | ICD-10-CM

## 2018-08-09 DIAGNOSIS — Z79899 Other long term (current) drug therapy: Secondary | ICD-10-CM | POA: Insufficient documentation

## 2018-08-09 DIAGNOSIS — R630 Anorexia: Secondary | ICD-10-CM | POA: Insufficient documentation

## 2018-08-09 DIAGNOSIS — R5383 Other fatigue: Secondary | ICD-10-CM | POA: Diagnosis not present

## 2018-08-09 DIAGNOSIS — R51 Headache: Secondary | ICD-10-CM | POA: Diagnosis not present

## 2018-08-09 LAB — BASIC METABOLIC PANEL
Anion gap: 5 (ref 5–15)
BUN: 16 mg/dL (ref 6–20)
CO2: 27 mmol/L (ref 22–32)
Calcium: 8.7 mg/dL — ABNORMAL LOW (ref 8.9–10.3)
Chloride: 106 mmol/L (ref 98–111)
Creatinine, Ser: 0.71 mg/dL (ref 0.44–1.00)
GFR calc Af Amer: >60 mL/min (ref >60)
GFR calc non Af Amer: >60 mL/min (ref >60)
Glucose, Bld: 94 mg/dL (ref 70–99)
Potassium: 4.2 mmol/L (ref 3.5–5.1)
Sodium: 138 mmol/L (ref 135–145)

## 2018-08-09 LAB — URINALYSIS, COMPLETE (UACMP) WITH MICROSCOPIC
Bilirubin Urine: NEGATIVE
Glucose, UA: NEGATIVE mg/dL
Ketones, ur: NEGATIVE mg/dL
Leukocytes,Ua: NEGATIVE
Nitrite: NEGATIVE
Protein, ur: NEGATIVE mg/dL
Specific Gravity, Urine: 1.023 (ref 1.005–1.030)
pH: 5 (ref 5.0–8.0)

## 2018-08-09 LAB — CBC
HCT: 43.7 % (ref 36.0–46.0)
Hemoglobin: 14.3 g/dL (ref 12.0–15.0)
MCH: 30.4 pg (ref 26.0–34.0)
MCHC: 32.7 g/dL (ref 30.0–36.0)
MCV: 92.8 fL (ref 80.0–100.0)
Platelets: 353 10*3/uL (ref 150–400)
RBC: 4.71 MIL/uL (ref 3.87–5.11)
RDW: 12.3 % (ref 11.5–15.5)
WBC: 5.9 10*3/uL (ref 4.0–10.5)
nRBC: 0 % (ref 0.0–0.2)

## 2018-08-09 LAB — POCT PREGNANCY, URINE: Preg Test, Ur: NEGATIVE

## 2018-08-09 MED ORDER — NAPROXEN 500 MG PO TABS: 500.0000 mg | ORAL_TABLET | Freq: Two times a day (BID) | ORAL | 0 refills | Status: DC

## 2018-08-09 MED ORDER — ONDANSETRON 4 MG PO TBDP
8.0000 mg | ORAL_TABLET | Freq: Once | ORAL | Status: AC
  Administered 2018-08-09: 8 mg via ORAL
  Filled 2018-08-09: qty 2

## 2018-08-09 MED ORDER — NAPROXEN 500 MG PO TABS
500.0000 mg | ORAL_TABLET | Freq: Once | ORAL | Status: AC
  Administered 2018-08-09: 22:00:00 500 mg via ORAL
  Filled 2018-08-09: qty 1

## 2018-08-09 MED ORDER — ONDANSETRON 4 MG PO TBDP: 4.0000 mg | ORAL_TABLET | Freq: Three times a day (TID) | ORAL | 0 refills | Status: DC | PRN
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
## 2018-08-09 NOTE — Discharge Instructions (Signed)
Please stay home and avoid contact with others until your coronavirus test result is available. If it is positive, you will need to quarantine at home for 2 weeks.   In the meantime, take ibuprofen or naproxen for pain, and zofran as needed for nausea.

## 2018-08-09 NOTE — ED Triage Notes (Signed)
Pt presents to ED via POV c/o pain to "entire left side of body" from head to foot starting 08/02/18. Pt states pain started to foot with ambulation and slowly spread up L side of body.

## 2018-08-11 LAB — NOVEL CORONAVIRUS, NAA (HOSP ORDER, SEND-OUT TO REF LAB; TAT 18-24 HRS): SARS-CoV-2, NAA: NOT DETECTED

## 2018-08-17 ENCOUNTER — Telehealth: Payer: Self-pay | Admitting: Emergency Medicine

## 2018-08-17 NOTE — Telephone Encounter (Signed)
Called patient to informe of covid 19 negative result.  Left message.

## 2019-02-01 ENCOUNTER — Ambulatory Visit (INDEPENDENT_AMBULATORY_CARE_PROVIDER_SITE_OTHER): Payer: BC Managed Care – PPO | Admitting: Urology

## 2019-02-01 ENCOUNTER — Encounter: Payer: Self-pay | Admitting: Urology

## 2019-02-01 ENCOUNTER — Other Ambulatory Visit: Payer: Self-pay

## 2019-02-01 VITALS — BP 108/61 | HR 62 | Ht <= 58 in | Wt 132.6 lb

## 2019-02-01 DIAGNOSIS — N390 Urinary tract infection, site not specified: Secondary | ICD-10-CM

## 2019-02-01 LAB — MICROSCOPIC EXAMINATION

## 2019-02-01 LAB — URINALYSIS, COMPLETE
Bilirubin, UA: NEGATIVE
Glucose, UA: NEGATIVE
Ketones, UA: NEGATIVE
Leukocytes,UA: NEGATIVE
Nitrite, UA: NEGATIVE
Protein,UA: NEGATIVE
Specific Gravity, UA: 1.025 (ref 1.005–1.030)
Urobilinogen, Ur: 0.2 mg/dL (ref 0.2–1.0)
pH, UA: 7.5 (ref 5.0–7.5)
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
## 2019-02-02 ENCOUNTER — Encounter: Payer: Self-pay | Admitting: Urology

## 2019-03-09 ENCOUNTER — Encounter: Payer: Self-pay | Admitting: Urology

## 2019-03-09 ENCOUNTER — Other Ambulatory Visit: Payer: Self-pay

## 2019-03-09 ENCOUNTER — Ambulatory Visit (INDEPENDENT_AMBULATORY_CARE_PROVIDER_SITE_OTHER): Payer: BC Managed Care – PPO | Admitting: Urology

## 2019-03-09 VITALS — BP 99/64 | HR 70 | Ht <= 58 in | Wt 132.0 lb

## 2019-03-09 DIAGNOSIS — N390 Urinary tract infection, site not specified: Secondary | ICD-10-CM | POA: Diagnosis not present

## 2019-03-09 NOTE — Progress Notes (Signed)
   03/09/19  CC:  Chief Complaint  Patient presents with  . Cysto    HPI: Refer to my prior progress note 02/01/2019. She denies recurrent infections since her last visit.  No complaints today.  Blood pressure 99/64, pulse 70, height 4\' 10"  (1.473 m), weight 132 lb (59.9 kg). NED. A&Ox3.   No respiratory distress   Abd soft, NT, ND Normal external genitalia with patent urethral meatus  Cystoscopy Procedure Note  Patient identification was confirmed, informed consent was obtained, and patient was prepped using Betadine solution.  Lidocaine jelly was administered per urethral meatus.    Procedure: - Flexible cystoscope introduced, without any difficulty.   - Thorough search of the bladder revealed:    normal urethral meatus    normal urothelium    no stones    no ulcers     no tumors    no urethral polyps    no trabeculation  - Ureteral orifices were normal in position and appearance.  Post-Procedure: - Patient tolerated the procedure well  Assessment/ Plan: -No abnormalities identified on cystoscopy. -Recommended starting cranberry tablets and D-mannose. -Follow-up recheck with Larene [place] 2 months -Call earlier for recurrent UTI symptoms   Abbie Sons, MD

## 2019-03-10 LAB — URINALYSIS, COMPLETE
Bilirubin, UA: NEGATIVE
Glucose, UA: NEGATIVE
Ketones, UA: NEGATIVE
Leukocytes,UA: NEGATIVE
Nitrite, UA: NEGATIVE
Protein,UA: NEGATIVE
Specific Gravity, UA: 1.02 (ref 1.005–1.030)
Urobilinogen, Ur: 0.2 mg/dL (ref 0.2–1.0)
pH, UA: 8.5 — ABNORMAL HIGH (ref 5.0–7.5)

## 2019-03-10 LAB — MICROSCOPIC EXAMINATION: RBC: NONE SEEN /hpf (ref 0–2)

## 2019-05-09 ENCOUNTER — Other Ambulatory Visit: Payer: Self-pay

## 2019-05-09 ENCOUNTER — Ambulatory Visit (INDEPENDENT_AMBULATORY_CARE_PROVIDER_SITE_OTHER): Payer: BC Managed Care – PPO | Admitting: Urology

## 2019-05-09 VITALS — BP 115/78 | HR 61 | Ht <= 58 in | Wt 130.0 lb

## 2019-05-09 DIAGNOSIS — K529 Noninfective gastroenteritis and colitis, unspecified: Secondary | ICD-10-CM

## 2019-05-09 DIAGNOSIS — N390 Urinary tract infection, site not specified: Secondary | ICD-10-CM

## 2019-05-09 DIAGNOSIS — N946 Dysmenorrhea, unspecified: Secondary | ICD-10-CM

## 2019-05-09 DIAGNOSIS — N3281 Overactive bladder: Secondary | ICD-10-CM

## 2019-05-09 MED ORDER — MIRABEGRON ER 25 MG PO TB24: 25.0000 mg | ORAL_TABLET | Freq: Every day | ORAL | 0 refills | Status: DC
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
## 2019-05-10 ENCOUNTER — Telehealth: Payer: Self-pay | Admitting: Obstetrics & Gynecology

## 2019-05-10 NOTE — Telephone Encounter (Signed)
[facility] referring for Menstrual pain. Called with an interpreter and left voicemail for patient to call back to be schedule

## 2019-05-12 ENCOUNTER — Encounter: Payer: Self-pay | Admitting: Urology

## 2019-05-15 NOTE — Telephone Encounter (Signed)
Called and left voice mail for patient to call back to be schedule °

## 2019-05-31 ENCOUNTER — Encounter: Payer: Self-pay | Admitting: Urology

## 2019-05-31 ENCOUNTER — Ambulatory Visit (INDEPENDENT_AMBULATORY_CARE_PROVIDER_SITE_OTHER): Payer: BC Managed Care – PPO | Admitting: Urology

## 2019-05-31 ENCOUNTER — Other Ambulatory Visit: Payer: Self-pay

## 2019-05-31 VITALS — BP 117/78 | HR 62 | Ht <= 58 in | Wt 130.0 lb

## 2019-05-31 DIAGNOSIS — N3281 Overactive bladder: Secondary | ICD-10-CM

## 2019-05-31 LAB — BLADDER SCAN AMB NON-IMAGING: Scan Result: 30

## 2019-05-31 MED ORDER — MIRABEGRON ER 25 MG PO TB24: 25.0000 mg | ORAL_TABLET | Freq: Every day | ORAL | 3 refills | Status: DC
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
## 2019-06-21 ENCOUNTER — Encounter: Payer: BLUE CROSS/BLUE SHIELD | Admitting: Obstetrics and Gynecology

## 2019-06-22 NOTE — Telephone Encounter (Signed)
Called and left voicemail for patient to call back to be scheduled. 

## 2019-07-10 ENCOUNTER — Encounter: Payer: Self-pay | Admitting: Gastroenterology

## 2019-07-10 ENCOUNTER — Other Ambulatory Visit: Payer: Self-pay

## 2019-07-10 ENCOUNTER — Ambulatory Visit (INDEPENDENT_AMBULATORY_CARE_PROVIDER_SITE_OTHER): Payer: BC Managed Care – PPO | Admitting: Gastroenterology

## 2019-07-10 VITALS — BP 159/78 | HR 89 | Temp 98.2°F | Ht <= 58 in | Wt 131.1 lb

## 2019-07-10 DIAGNOSIS — R1032 Left lower quadrant pain: Secondary | ICD-10-CM | POA: Diagnosis not present

## 2019-07-10 DIAGNOSIS — R140 Abdominal distension (gaseous): Secondary | ICD-10-CM

## 2019-07-10 DIAGNOSIS — R1013 Epigastric pain: Secondary | ICD-10-CM

## 2019-07-10 DIAGNOSIS — G8929 Other chronic pain: Secondary | ICD-10-CM

## 2019-07-10 MED ORDER — NA SULFATE-K SULFATE-MG SULF 17.5-3.13-1.6 GM/177ML PO SOLN: 354.0000 mL | Freq: Once | ORAL | 0 refills | Status: AC
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
## 2019-07-13 LAB — GI PROFILE, STOOL, PCR

## 2019-08-01 ENCOUNTER — Other Ambulatory Visit
Admission: RE | Admit: 2019-08-01 | Discharge: 2019-08-01 | Disposition: A | Discharge status: Home / Self Care | Payer: BC Managed Care – PPO | Source: Ambulatory Visit | Attending: Gastroenterology | Admitting: Gastroenterology

## 2019-08-01 ENCOUNTER — Other Ambulatory Visit: Payer: Self-pay

## 2019-08-01 DIAGNOSIS — Z20822 Contact with and (suspected) exposure to covid-19: Secondary | ICD-10-CM | POA: Diagnosis not present

## 2019-08-01 DIAGNOSIS — Z01812 Encounter for preprocedural laboratory examination: Secondary | ICD-10-CM | POA: Diagnosis present

## 2019-08-02 LAB — SARS CORONAVIRUS 2 (TAT 6-24 HRS): SARS Coronavirus 2: NEGATIVE

## 2019-08-03 ENCOUNTER — Ambulatory Visit: Payer: BC Managed Care – PPO | Admitting: Anesthesiology

## 2019-08-03 ENCOUNTER — Encounter: Payer: Self-pay | Admitting: Gastroenterology

## 2019-08-03 ENCOUNTER — Ambulatory Visit
Admission: RE | Admit: 2019-08-03 | Discharge: 2019-08-03 | Disposition: A | Discharge status: Home / Self Care | Payer: BC Managed Care – PPO | Attending: Gastroenterology | Admitting: Gastroenterology

## 2019-08-03 ENCOUNTER — Encounter
Admission: RE | Disposition: A | Discharge status: Home / Self Care | Payer: Self-pay | Source: Home / Self Care | Attending: Gastroenterology

## 2019-08-03 DIAGNOSIS — R1013 Epigastric pain: Secondary | ICD-10-CM

## 2019-08-03 DIAGNOSIS — R140 Abdominal distension (gaseous): Secondary | ICD-10-CM

## 2019-08-03 DIAGNOSIS — B9681 Helicobacter pylori [H. pylori] as the cause of diseases classified elsewhere: Secondary | ICD-10-CM | POA: Diagnosis not present

## 2019-08-03 DIAGNOSIS — G8929 Other chronic pain: Secondary | ICD-10-CM | POA: Diagnosis not present

## 2019-08-03 DIAGNOSIS — R1032 Left lower quadrant pain: Secondary | ICD-10-CM | POA: Diagnosis not present

## 2019-08-03 DIAGNOSIS — D125 Benign neoplasm of sigmoid colon: Secondary | ICD-10-CM | POA: Insufficient documentation

## 2019-08-03 DIAGNOSIS — K219 Gastro-esophageal reflux disease without esophagitis: Secondary | ICD-10-CM | POA: Insufficient documentation

## 2019-08-03 DIAGNOSIS — K2950 Unspecified chronic gastritis without bleeding: Secondary | ICD-10-CM | POA: Diagnosis not present

## 2019-08-03 DIAGNOSIS — F319 Bipolar disorder, unspecified: Secondary | ICD-10-CM | POA: Diagnosis not present

## 2019-08-03 HISTORY — PX: COLONOSCOPY WITH PROPOFOL: SHX5780

## 2019-08-03 HISTORY — PX: ESOPHAGOGASTRODUODENOSCOPY (EGD) WITH PROPOFOL: SHX5813

## 2019-08-03 LAB — POCT PREGNANCY, URINE: Preg Test, Ur: NEGATIVE

## 2019-08-03 SURGERY — COLONOSCOPY WITH PROPOFOL
Anesthesia: General

## 2019-08-03 MED ORDER — PROPOFOL 500 MG/50ML IV EMUL
INTRAVENOUS | Status: DC | PRN
  Administered 2019-08-03: 120 ug/kg/min via INTRAVENOUS

## 2019-08-03 MED ORDER — MIDAZOLAM HCL 2 MG/2ML IJ SOLN
INTRAMUSCULAR | Status: AC
  Filled 2019-08-03: qty 2

## 2019-08-03 MED ORDER — LIDOCAINE HCL (PF) 2 % IJ SOLN
INTRAMUSCULAR | Status: AC
  Filled 2019-08-03: qty 5

## 2019-08-03 MED ORDER — FENTANYL CITRATE (PF) 100 MCG/2ML IJ SOLN
INTRAMUSCULAR | Status: DC | PRN
  Administered 2019-08-03: 50 ug via INTRAVENOUS

## 2019-08-03 MED ORDER — FENTANYL CITRATE (PF) 100 MCG/2ML IJ SOLN
INTRAMUSCULAR | Status: AC
  Filled 2019-08-03: qty 2

## 2019-08-03 MED ORDER — MIDAZOLAM HCL 2 MG/2ML IJ SOLN
INTRAMUSCULAR | Status: DC | PRN
  Administered 2019-08-03: 2 mg via INTRAVENOUS

## 2019-08-03 MED ORDER — PROPOFOL 500 MG/50ML IV EMUL
INTRAVENOUS | Status: AC
  Filled 2019-08-03: qty 50

## 2019-08-03 MED ORDER — SODIUM CHLORIDE 0.9 % IV SOLN: INTRAVENOUS | Status: DC
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
## 2019-08-03 NOTE — Op Note (Signed)
[facility] Gastroenterology Patient Name: Kelly Ballard Procedure Date: 08/03/2019 9:44 AM MRN: [id number] Account #: [id number] Date of Birth: [birth year] Admit Type: Outpatient Age: 44 Room: [facility] ENDO ROOM 1 Gender: Female Note Status: Finalized Procedure:             Upper GI endoscopy Indications:           Dyspepsia Providers:             Lin Landsman MD, MD Referring MD:          [facility], MD (Referring MD) Medicines:             Monitored Anesthesia Care Complications:         No immediate complications. Estimated blood loss: None. Procedure:             Pre-Anesthesia Assessment:                        - Prior to the procedure, a History and Physical was                         performed, and patient medications and allergies were                         reviewed. The patient is competent. The risks and                         benefits of the procedure and the sedation options and                         risks were discussed with the patient. All questions                         were answered and informed consent was obtained.                         Patient identification and proposed procedure were                         verified by the physician, the nurse, the                         anesthesiologist, the anesthetist and the technician                         in the pre-procedure area in the procedure room in the                         endoscopy suite. Mental Status Examination: alert and                         oriented. Airway Examination: normal oropharyngeal                         airway and neck mobility. Respiratory Examination:                         clear to auscultation. CV Examination: normal.  Prophylactic Antibiotics: The patient does not require                         prophylactic antibiotics. Prior Anticoagulants: The                         patient has taken no previous  anticoagulant or                         antiplatelet agents. ASA Grade Assessment: II - A                         patient with mild systemic disease. After reviewing                         the risks and benefits, the patient was deemed in                         satisfactory condition to undergo the procedure. The                         anesthesia plan was to use monitored anesthesia care                         (MAC). Immediately prior to administration of                         medications, the patient was re-assessed for adequacy                         to receive sedatives. The heart rate, respiratory                         rate, oxygen saturations, blood pressure, adequacy of                         pulmonary ventilation, and response to care were                         monitored throughout the procedure. The physical                         status of the patient was re-assessed after the                         procedure.                        After obtaining informed consent, the endoscope was                         passed under direct vision. Throughout the procedure,                         the patient's blood pressure, pulse, and oxygen                         saturations were monitored continuously. The Endoscope  was introduced through the mouth, and advanced to the                         second part of duodenum. The upper GI endoscopy was                         accomplished without difficulty. The patient tolerated                         the procedure well. Findings:      The esophagus, gastroesophageal junction and examined esophagus were       normal.      The stomach was normal. Biopsies were taken with a cold forceps for       Helicobacter pylori testing.      The examined duodenum was normal.      The cardia and gastric fundus were normal on retroflexion.      Esophagogastric landmarks were identified: the gastroesophageal junction        was found at 33 cm from the incisors. Impression:            - Normal esophagus.                        - Normal stomach.                        - Normal examined duodenum.                        - No specimens collected. Recommendation:        - Await pathology results.                        - Proceed with colonoscopy as scheduled                        See colonoscopy report Procedure Code(s):     --- Professional ---                        [phone], Esophagogastroduodenoscopy, flexible,                         transoral; with biopsy, single or multiple Diagnosis Code(s):     --- Professional ---                        R10.13, Epigastric pain CPT copyright 2019 American Medical Association. All rights reserved. The codes documented in this report are preliminary and upon coder review may  be revised to meet current compliance requirements. Dr. Ulyess Mort Lin Landsman MD, MD 08/03/2019 9:58:38 AM This report has been signed electronically. Number of Addenda: 0 Note Initiated On: 08/03/2019 9:44 AM Estimated Blood Loss:  Estimated blood loss: none. Estimated blood loss: none.      [facility]

## 2019-08-03 NOTE — Anesthesia Preprocedure Evaluation (Addendum)
Anesthesia Evaluation  Patient identified by MRN, date of birth, ID band Patient awake    Reviewed: Allergy & Precautions, NPO status , Patient's Chart, lab work & pertinent test results  History of Anesthesia Complications Negative for: history of anesthetic complications  Airway Mallampati: II       Dental   Pulmonary neg sleep apnea, neg COPD, Not current smoker,           Cardiovascular (-) hypertension(-) Past MI and (-) CHF (-) dysrhythmias (-) Valvular Problems/Murmurs     Neuro/Psych neg Seizures Anxiety Depression Bipolar Disorder    GI/Hepatic Neg liver ROS, GERD  ,  Endo/Other  neg diabetes  Renal/GU negative Renal ROS     Musculoskeletal   Abdominal   Peds  Hematology   Anesthesia Other Findings   Reproductive/Obstetrics                            Anesthesia Physical Anesthesia Plan  ASA: II  Anesthesia Plan: General   Post-op Pain Management:    Induction: Intravenous  PONV Risk Score and Plan: 3 and Propofol infusion, TIVA and Treatment may vary due to age or medical condition  Airway Management Planned: Nasal Cannula  Additional Equipment:   Intra-op Plan:   Post-operative Plan:   Informed Consent: I have reviewed the patients History and Physical, chart, labs and discussed the procedure including the risks, benefits and alternatives for the proposed anesthesia with the patient or authorized representative who has indicated his/her understanding and acceptance.       Plan Discussed with:   Anesthesia Plan Comments:         Anesthesia Quick Evaluation

## 2019-08-03 NOTE — Transfer of Care (Signed)
Immediate Anesthesia Transfer of Care Note  Patient: CAIDENCE KASEMAN  Procedure(s) Performed: COLONOSCOPY WITH PROPOFOL (N/A ) ESOPHAGOGASTRODUODENOSCOPY (EGD) WITH PROPOFOL (N/A )  Patient Location: PACU  Anesthesia Type:General  Level of Consciousness: awake, alert , oriented and sedated  Airway & Oxygen Therapy: Patient Spontanous Breathing and Patient connected to nasal cannula oxygen  Post-op Assessment: Report given to RN and Post -op Vital signs reviewed and stable  Post vital signs: Reviewed and stable  Last Vitals:  Vitals Value Taken Time  BP    Temp    Pulse    Resp    SpO2      Last Pain:  Vitals:   08/03/19 0920  TempSrc: Temporal  PainSc: 0-No pain         Complications: No complications documented.

## 2019-08-03 NOTE — Anesthesia Postprocedure Evaluation (Signed)
Anesthesia Post Note  Patient: Kelly Ballard  Procedure(s) Performed: COLONOSCOPY WITH PROPOFOL (N/A ) ESOPHAGOGASTRODUODENOSCOPY (EGD) WITH PROPOFOL (N/A )  Patient location during evaluation: Endoscopy Anesthesia Type: General Level of consciousness: awake and alert Pain management: pain level controlled Vital Signs Assessment: post-procedure vital signs reviewed and stable Respiratory status: spontaneous breathing and respiratory function stable Cardiovascular status: stable Anesthetic complications: no   No complications documented.   Last Vitals:  Vitals:   08/03/19 1010 08/03/19 1030  BP: 101/68 114/71  Pulse: 73 73  Resp: 16 17  Temp: 36.9 C   SpO2: 100% 100%    Last Pain:  Vitals:   08/03/19 1010  TempSrc: Temporal  PainSc:                  Thurl Boen K

## 2019-08-03 NOTE — Op Note (Addendum)
[facility] Gastroenterology Patient Name: Kelly Ballard Procedure Date: 08/03/2019 9:44 AM MRN: [id number] Account #: [id number] Date of Birth: 1975-05-25 Admit Type: Outpatient Age: 44 Room: [facility] ENDO ROOM 1 Gender: Female Note Status: Finalized Procedure:             Colonoscopy Indications:           This is the patient's first colonoscopy, Abdominal                         pain in the left lower quadrant Providers:             Lin Landsman MD, MD Medicines:             Monitored Anesthesia Care Complications:         No immediate complications. Estimated blood loss: None. Procedure:             Pre-Anesthesia Assessment:                        - Prior to the procedure, a History and Physical was                         performed, and patient medications and allergies were                         reviewed. The patient is competent. The risks and                         benefits of the procedure and the sedation options and                         risks were discussed with the patient. All questions                         were answered and informed consent was obtained.                         Patient identification and proposed procedure were                         verified by the physician, the nurse, the                         anesthesiologist, the anesthetist and the technician                         in the pre-procedure area in the procedure room in the                         endoscopy suite. Mental Status Examination: alert and                         oriented. Airway Examination: normal oropharyngeal                         airway and neck mobility. Respiratory Examination:                         clear to auscultation. CV  Examination: normal.                         Prophylactic Antibiotics: The patient does not require                         prophylactic antibiotics. Prior Anticoagulants: The                         patient has taken no  previous anticoagulant or                         antiplatelet agents. ASA Grade Assessment: II - A                         patient with mild systemic disease. After reviewing                         the risks and benefits, the patient was deemed in                         satisfactory condition to undergo the procedure. The                         anesthesia plan was to use monitored anesthesia care                         (MAC). Immediately prior to administration of                         medications, the patient was re-assessed for adequacy                         to receive sedatives. The heart rate, respiratory                         rate, oxygen saturations, blood pressure, adequacy of                         pulmonary ventilation, and response to care were                         monitored throughout the procedure. The physical                         status of the patient was re-assessed after the                         procedure.                        After obtaining informed consent, the colonoscope was                         passed under direct vision. Throughout the procedure,                         the patient's blood pressure, pulse, and oxygen  saturations were monitored continuously. The                         Colonoscope was introduced through the anus and                         advanced to the the terminal ileum, with                         identification of the appendiceal orifice and IC                         valve. The colonoscopy was performed without                         difficulty. The patient tolerated the procedure well.                         The quality of the bowel preparation was evaluated                         using the BBPS [facility] Bowel Preparation Scale) with                         scores of: Right Colon = 3, Transverse Colon = 3 and                         Left Colon = 3 (entire mucosa seen well with no                          residual staining, small fragments of stool or opaque                         liquid). The total BBPS score equals 9. Findings:      The perianal and digital rectal examinations were normal. Pertinent       negatives include normal sphincter tone and no palpable rectal lesions.      The terminal ileum appeared normal.      A 5 mm polyp was found in the sigmoid colon. The polyp was sessile. The       polyp was removed with a cold snare. Resection and retrieval were       complete.      The retroflexed view of the distal rectum and anal verge was normal and       showed no anal or rectal abnormalities.      The entire examined colon appeared normal. Impression:            - The examined portion of the ileum was normal.                        - One 5 mm polyp in the sigmoid colon, removed with a                         cold snare. Resected and retrieved.                        - The distal rectum and anal verge are normal on  retroflexion view.                        - The entire examined colon is normal. Recommendation:        - Discharge patient to home (with escort).                        - Resume previous diet today.                        - Continue present medications.                        - Await pathology results.                        - Repeat colonoscopy in 7-10 years for surveillance                         based on pathology results. Procedure Code(s):     --- Professional ---                        [phone], Colonoscopy, flexible; with removal of                         tumor(s), polyp(s), or other lesion(s) by snare                         technique Diagnosis Code(s):     --- Professional ---                        K63.5, Polyp of colon                        R10.32, Left lower quadrant pain CPT copyright 2019 American Medical Association. All rights reserved. The codes documented in this report are preliminary and upon coder review may   be revised to meet current compliance requirements. Dr. Ulyess Mort Lin Landsman MD, MD 08/03/2019 10:12:06 AM This report has been signed electronically. Number of Addenda: 0 Note Initiated On: 08/03/2019 9:44 AM Scope Withdrawal Time: 0 hours 6 minutes 11 seconds  Total Procedure Duration: 0 hours 8 minutes 38 seconds  Estimated Blood Loss:  Estimated blood loss: none.      [facility]

## 2019-08-04 ENCOUNTER — Encounter: Payer: Self-pay | Admitting: Gastroenterology

## 2019-08-04 LAB — SURGICAL PATHOLOGY

## 2019-08-07 ENCOUNTER — Encounter: Payer: Self-pay | Admitting: Gastroenterology

## 2019-08-07 ENCOUNTER — Telehealth: Payer: Self-pay

## 2019-08-07 DIAGNOSIS — Q400 Congenital hypertrophic pyloric stenosis: Secondary | ICD-10-CM

## 2019-08-07 MED ORDER — CLARITHROMYCIN 500 MG PO TABS
500.0000 mg | ORAL_TABLET | Freq: Two times a day (BID) | ORAL | 0 refills | Status: DC
Start: 2019-08-07 — End: 2019-10-17

## 2019-08-07 MED ORDER — AMOXICILLIN 500 MG PO TABS: 1000.0000 mg | ORAL_TABLET | Freq: Two times a day (BID) | ORAL | 0 refills | Status: DC

## 2019-08-07 MED ORDER — OMEPRAZOLE 40 MG PO CPDR
40.0000 mg | DELAYED_RELEASE_CAPSULE | Freq: Two times a day (BID) | ORAL | 0 refills | Status: DC
Start: 2019-08-07 — End: 2019-10-17

## 2019-08-07 NOTE — Telephone Encounter (Signed)
-----   Message from Lin Landsman, MD sent at 08/07/2019  2:32 PM EDT ----- Plz send her the prescription for triple therapy to treat H Pylori for 14days  Omeprazole 40mg  BID Clarithromycin 500mg  BID Amoxicillin 1gm BID  Order H Pylori breath test in 4weeks after completing medication to confirm eradication. She should be off prilosec and H2 blocker atleast for 2weeks before the test  Thanks RV

## 2019-08-07 NOTE — Telephone Encounter (Signed)
Sent medication to the pharmacy and also order the lab test for 6 weeks.

## 2019-08-22 ENCOUNTER — Encounter: Payer: Self-pay | Admitting: Emergency Medicine

## 2019-08-22 ENCOUNTER — Emergency Department
Admission: EM | Admit: 2019-08-22 | Discharge: 2019-08-22 | Disposition: A | Discharge status: Home / Self Care | Payer: BC Managed Care – PPO | Attending: Emergency Medicine | Admitting: Emergency Medicine

## 2019-08-22 ENCOUNTER — Other Ambulatory Visit: Payer: Self-pay

## 2019-08-22 DIAGNOSIS — F4310 Post-traumatic stress disorder, unspecified: Secondary | ICD-10-CM | POA: Diagnosis not present

## 2019-08-22 DIAGNOSIS — F319 Bipolar disorder, unspecified: Secondary | ICD-10-CM | POA: Insufficient documentation

## 2019-08-22 DIAGNOSIS — G4700 Insomnia, unspecified: Secondary | ICD-10-CM

## 2019-08-22 DIAGNOSIS — Z9114 Patient's other noncompliance with medication regimen: Secondary | ICD-10-CM | POA: Diagnosis not present

## 2019-08-22 MED ORDER — LITHIUM CARBONATE 150 MG PO CAPS
150.0000 mg | ORAL_CAPSULE | Freq: Every day | ORAL | 2 refills | Status: DC
Start: 2019-08-22 — End: 2020-04-30

## 2019-08-22 MED ORDER — TRAZODONE HCL 100 MG PO TABS
100.0000 mg | ORAL_TABLET | Freq: Every day | ORAL | 1 refills | Status: DC
Start: 2019-08-22 — End: 2020-04-30

## 2019-08-22 NOTE — ED Notes (Signed)
RN offered to use [facility] interpreter and pt declined

## 2019-08-22 NOTE — ED Notes (Signed)
Pt states coming in due to not being able to sleep for a week. PT states she does have bipolar disorder and that makes it hard. Pt states husband is going to take her home.

## 2019-08-22 NOTE — ED Triage Notes (Signed)
Pt to ED from home c/o difficulty sleeping for several days, states when she falls asleep she "sees people" while sleeping, they're not there when she's awake and she wakes up tired.  Denies pain or other complaints.
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
## 2019-09-05 ENCOUNTER — Other Ambulatory Visit: Payer: Self-pay

## 2019-09-05 ENCOUNTER — Ambulatory Visit (INDEPENDENT_AMBULATORY_CARE_PROVIDER_SITE_OTHER): Payer: BC Managed Care – PPO | Admitting: Urology

## 2019-09-05 ENCOUNTER — Encounter: Payer: Self-pay | Admitting: Urology

## 2019-09-05 DIAGNOSIS — N390 Urinary tract infection, site not specified: Secondary | ICD-10-CM

## 2019-09-05 DIAGNOSIS — N3281 Overactive bladder: Secondary | ICD-10-CM

## 2019-09-05 LAB — BLADDER SCAN AMB NON-IMAGING: SCA Result: 4
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
## 2019-10-17 ENCOUNTER — Ambulatory Visit (INDEPENDENT_AMBULATORY_CARE_PROVIDER_SITE_OTHER): Payer: BC Managed Care – PPO | Admitting: Gastroenterology

## 2019-10-17 ENCOUNTER — Encounter: Payer: Self-pay | Admitting: Gastroenterology

## 2019-10-17 ENCOUNTER — Other Ambulatory Visit: Payer: Self-pay

## 2019-10-17 VITALS — BP 111/78 | HR 80 | Temp 98.5°F | Wt 132.2 lb

## 2019-10-17 DIAGNOSIS — D126 Benign neoplasm of colon, unspecified: Secondary | ICD-10-CM

## 2019-10-17 DIAGNOSIS — A048 Other specified bacterial intestinal infections: Secondary | ICD-10-CM | POA: Diagnosis not present

## 2019-10-17 DIAGNOSIS — K2970 Gastritis, unspecified, without bleeding: Secondary | ICD-10-CM | POA: Insufficient documentation

## 2019-10-17 DIAGNOSIS — B9681 Helicobacter pylori [H. pylori] as the cause of diseases classified elsewhere: Secondary | ICD-10-CM | POA: Diagnosis not present

## 2019-10-17 MED ORDER — CLARITHROMYCIN 500 MG PO TABS: 500.0000 mg | ORAL_TABLET | Freq: Two times a day (BID) | ORAL | 0 refills | Status: AC

## 2019-10-17 MED ORDER — AMOXICILLIN 500 MG PO TABS: 1000.0000 mg | ORAL_TABLET | Freq: Two times a day (BID) | ORAL | 0 refills | Status: AC

## 2019-10-17 MED ORDER — OMEPRAZOLE 20 MG PO CPDR
20.0000 mg | DELAYED_RELEASE_CAPSULE | Freq: Two times a day (BID) | ORAL | 0 refills | Status: DC
Start: 2019-10-17 — End: 2020-01-16

## 2019-10-17 NOTE — Patient Instructions (Signed)
1.Sent prescriptions to pharmacy  2.come back in 6 weeks for a lab test  3. Follow up in 3 months    Please call our office to speak with my nurse Ulyess Blossom [phone] during business hours from 8am to 4pm if you have any questions/concerns. During after hours, you will be redirected to on call GI physician. For any emergency please call 911 or go the nearest emergency room.    Cephas Darby, MD [address]  [place]  [place], [place] [address]  Main: [phone]  Fax: [phone]
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
## 2019-11-27 ENCOUNTER — Other Ambulatory Visit: Payer: Self-pay

## 2019-11-27 DIAGNOSIS — Q400 Congenital hypertrophic pyloric stenosis: Secondary | ICD-10-CM

## 2019-11-28 LAB — H. PYLORI BREATH TEST: H pylori Breath Test: NEGATIVE

## 2020-01-16 ENCOUNTER — Encounter: Payer: Self-pay | Admitting: Gastroenterology

## 2020-01-16 ENCOUNTER — Ambulatory Visit (INDEPENDENT_AMBULATORY_CARE_PROVIDER_SITE_OTHER): Payer: BC Managed Care – PPO | Admitting: Gastroenterology

## 2020-01-16 ENCOUNTER — Other Ambulatory Visit: Payer: Self-pay

## 2020-01-16 VITALS — BP 154/77 | HR 70 | Temp 98.1°F | Ht <= 58 in | Wt 143.1 lb

## 2020-01-16 DIAGNOSIS — R1013 Epigastric pain: Secondary | ICD-10-CM | POA: Diagnosis not present

## 2020-01-16 DIAGNOSIS — A048 Other specified bacterial intestinal infections: Secondary | ICD-10-CM | POA: Diagnosis not present
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
## 2020-01-18 LAB — H. PYLORI BREATH TEST: H pylori Breath Test: NEGATIVE

## 2020-04-15 ENCOUNTER — Encounter: Payer: Self-pay | Admitting: *Deleted

## 2020-04-15 ENCOUNTER — Ambulatory Visit: Payer: BC Managed Care – PPO | Admitting: Gastroenterology

## 2020-04-30 ENCOUNTER — Encounter: Payer: Self-pay | Admitting: Gastroenterology

## 2020-04-30 ENCOUNTER — Ambulatory Visit (INDEPENDENT_AMBULATORY_CARE_PROVIDER_SITE_OTHER): Payer: No Typology Code available for payment source | Admitting: Gastroenterology

## 2020-04-30 ENCOUNTER — Other Ambulatory Visit: Payer: Self-pay

## 2020-04-30 VITALS — BP 111/77 | HR 77 | Temp 98.0°F | Ht <= 58 in | Wt 135.2 lb

## 2020-04-30 DIAGNOSIS — A048 Other specified bacterial intestinal infections: Secondary | ICD-10-CM

## 2020-04-30 DIAGNOSIS — R1013 Epigastric pain: Secondary | ICD-10-CM
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
## 2020-07-10 ENCOUNTER — Other Ambulatory Visit: Payer: Self-pay | Admitting: Family Medicine

## 2020-07-10 ENCOUNTER — Other Ambulatory Visit (HOSPITAL_COMMUNITY): Payer: Self-pay | Admitting: Family Medicine

## 2020-07-10 DIAGNOSIS — N946 Dysmenorrhea, unspecified: Secondary | ICD-10-CM

## 2021-01-14 ENCOUNTER — Other Ambulatory Visit: Payer: Self-pay | Admitting: Family Medicine

## 2021-01-14 DIAGNOSIS — R7989 Other specified abnormal findings of blood chemistry: Secondary | ICD-10-CM

## 2021-03-31 ENCOUNTER — Other Ambulatory Visit: Payer: Self-pay

## 2021-03-31 ENCOUNTER — Ambulatory Visit
Admission: RE | Admit: 2021-03-31 | Discharge: 2021-03-31 | Disposition: A | Discharge status: Home / Self Care | Payer: No Typology Code available for payment source | Source: Ambulatory Visit | Attending: Family Medicine | Admitting: Family Medicine

## 2021-03-31 ENCOUNTER — Other Ambulatory Visit: Payer: Self-pay | Admitting: Family Medicine

## 2021-03-31 DIAGNOSIS — R509 Fever, unspecified: Secondary | ICD-10-CM

## 2021-03-31 DIAGNOSIS — R1032 Left lower quadrant pain: Secondary | ICD-10-CM | POA: Insufficient documentation

## 2021-03-31 DIAGNOSIS — R1084 Generalized abdominal pain: Secondary | ICD-10-CM | POA: Diagnosis present

## 2021-03-31 DIAGNOSIS — R112 Nausea with vomiting, unspecified: Secondary | ICD-10-CM | POA: Insufficient documentation

## 2021-03-31 MED ORDER — IOHEXOL 300 MG/ML  SOLN
100.0000 mL | Freq: Once | INTRAMUSCULAR | Status: AC | PRN
  Administered 2021-03-31: 100 mL via INTRAVENOUS

## 2022-02-06 ENCOUNTER — Other Ambulatory Visit: Payer: Self-pay

## 2022-02-06 DIAGNOSIS — Z1231 Encounter for screening mammogram for malignant neoplasm of breast: Secondary | ICD-10-CM

## 2022-02-12 ENCOUNTER — Other Ambulatory Visit: Payer: Self-pay | Admitting: Physical Medicine & Rehabilitation

## 2022-02-12 DIAGNOSIS — M5412 Radiculopathy, cervical region: Secondary | ICD-10-CM

## 2022-03-06 ENCOUNTER — Inpatient Hospital Stay: Admission: RE | Admit: 2022-03-06 | Payer: No Typology Code available for payment source | Source: Ambulatory Visit

## 2022-04-01 ENCOUNTER — Ambulatory Visit
Admission: RE | Admit: 2022-04-01 | Discharge: 2022-04-01 | Disposition: A | Discharge status: Home / Self Care | Payer: No Typology Code available for payment source | Source: Ambulatory Visit | Attending: Physical Medicine & Rehabilitation | Admitting: Physical Medicine & Rehabilitation

## 2022-04-01 DIAGNOSIS — M5412 Radiculopathy, cervical region: Secondary | ICD-10-CM

## 2022-04-01 MED ORDER — IOPAMIDOL (ISOVUE-M 300) INJECTION 61%
1.0000 mL | Freq: Once | INTRAMUSCULAR | Status: AC
  Administered 2022-04-01: 1 mL via EPIDURAL

## 2022-04-01 MED ORDER — TRIAMCINOLONE ACETONIDE 40 MG/ML IJ SUSP (RADIOLOGY)
60.0000 mg | Freq: Once | INTRAMUSCULAR | Status: AC
  Administered 2022-04-01: 60 mg via EPIDURAL

## 2022-04-01 NOTE — Discharge Instructions (Addendum)

## 2022-05-08 ENCOUNTER — Emergency Department
Admission: EM | Admit: 2022-05-08 | Discharge: 2022-05-08 | Disposition: A | Discharge status: Home / Self Care | Payer: No Typology Code available for payment source | Attending: Emergency Medicine | Admitting: Emergency Medicine

## 2022-05-08 ENCOUNTER — Emergency Department: Payer: No Typology Code available for payment source

## 2022-05-08 ENCOUNTER — Other Ambulatory Visit: Payer: Self-pay

## 2022-05-08 DIAGNOSIS — N83201 Unspecified ovarian cyst, right side: Secondary | ICD-10-CM | POA: Diagnosis not present

## 2022-05-08 DIAGNOSIS — R42 Dizziness and giddiness: Secondary | ICD-10-CM | POA: Insufficient documentation

## 2022-05-08 DIAGNOSIS — N939 Abnormal uterine and vaginal bleeding, unspecified: Secondary | ICD-10-CM | POA: Diagnosis not present

## 2022-05-08 LAB — CBC
HCT: 45.7 % (ref 36.0–46.0)
Hemoglobin: 14.8 g/dL (ref 12.0–15.0)
MCH: 31 pg (ref 26.0–34.0)
MCHC: 32.4 g/dL (ref 30.0–36.0)
MCV: 95.6 fL (ref 80.0–100.0)
Platelets: 409 10*3/uL — ABNORMAL HIGH (ref 150–400)
RBC: 4.78 MIL/uL (ref 3.87–5.11)
RDW: 12.9 % (ref 11.5–15.5)
WBC: 7.7 10*3/uL (ref 4.0–10.5)
nRBC: 0 % (ref 0.0–0.2)

## 2022-05-08 LAB — URINALYSIS, ROUTINE W REFLEX MICROSCOPIC
Bilirubin Urine: NEGATIVE
Glucose, UA: NEGATIVE mg/dL
Ketones, ur: NEGATIVE mg/dL
Leukocytes,Ua: NEGATIVE
Nitrite: NEGATIVE
Protein, ur: 100 mg/dL — AB
RBC / HPF: >50 RBC/hpf (ref 0–5)
Specific Gravity, Urine: 1.026 (ref 1.005–1.030)
pH: 5 (ref 5.0–8.0)

## 2022-05-08 LAB — BASIC METABOLIC PANEL
Anion gap: 8 (ref 5–15)
BUN: 19 mg/dL (ref 6–20)
CO2: 23 mmol/L (ref 22–32)
Calcium: 8.5 mg/dL — ABNORMAL LOW (ref 8.9–10.3)
Chloride: 109 mmol/L (ref 98–111)
Creatinine, Ser: 0.8 mg/dL (ref 0.44–1.00)
GFR, Estimated: >60 mL/min (ref >60)
Glucose, Bld: 123 mg/dL — ABNORMAL HIGH (ref 70–99)
Potassium: 3.7 mmol/L (ref 3.5–5.1)
Sodium: 140 mmol/L (ref 135–145)

## 2022-05-08 LAB — TYPE AND SCREEN
ABO/RH(D): B POS
Antibody Screen: NEGATIVE

## 2022-05-08 LAB — POC URINE PREG, ED: Preg Test, Ur: NEGATIVE

## 2022-05-08 MED ORDER — SODIUM CHLORIDE 0.9 % IV BOLUS
1000.0000 mL | Freq: Once | INTRAVENOUS | Status: AC
  Administered 2022-05-08: 1000 mL via INTRAVENOUS

## 2022-05-08 NOTE — Discharge Instructions (Addendum)
Stay well hydrated. Follow up with primary care if dizziness continues. Follow up with gynecology for vaginal bleeding. If symptoms change or worsen, return to the ER.

## 2022-05-08 NOTE — ED Triage Notes (Addendum)
First nurse note: Pt to ED via [place] from [facility]. Pt reports dizziness and near syncope. Pt reports she is having increased bleeding on her period and was taken medication to help with bleeding.

## 2022-05-08 NOTE — ED Provider Notes (Signed)
[facility] Provider Note    Event Date/Time   First MD Initiated Contact with Patient 05/08/22 1558     (approximate)   History   Dizziness and Vaginal Bleeding   HPI  Kelly [place]ard is a 47 y.o. female with history of PCOS, H. pylori, and as listed in EMR presents to the emergency department for treatment and evaluation of dizziness with a near syncopal episode today.  Patient states that she has had 2 menstrual cycles this month.  The first 1 was about 2 weeks ago and lasted approximately 5 days.  Today, she has had vaginal bleeding, headache, and has felt dizzy.  Prior to onset of bleeding this morning, she denies vaginal discharge or pelvic pain.  She denies dysuria or flank pain.  No known fever.    Physical Exam   Triage Vital Signs: ED Triage Vitals  Enc Vitals Group     BP 05/08/22 1416 118/70     Pulse Rate 05/08/22 1416 71     Resp 05/08/22 1416 18     Temp 05/08/22 1416 97.8 F (36.6 C)     Temp Source 05/08/22 1416 Oral     SpO2 05/08/22 1416 97 %     Weight --      Height --      Head Circumference --      Peak Flow --      Pain Score 05/08/22 1411 0     Pain Loc --      Pain Edu? --      Excl. in [place]? --     Most recent vital signs: Vitals:   05/08/22 1619 05/08/22 2038  BP: 116/73 111/73  Pulse: 77 74  Resp: 18 14  Temp: 97.8 F (36.6 C) 97.8 F (36.6 C)  SpO2: 98% 98%    General: Awake, no distress.  CV:  Good peripheral perfusion.  Resp:  Normal effort.  Abd:  No distention. No focal abdominal or pelvic tenderness Other:     ED Results / Procedures / Treatments   Labs (all labs ordered are listed, but only abnormal results are displayed) Labs Reviewed  BASIC METABOLIC PANEL - Abnormal; Notable for the following components:      Result Value   Glucose, Bld 123 (*)    Calcium 8.5 (*)    All other components within normal limits  CBC - Abnormal; Notable for the following components:   Platelets 409 (*)     All other components within normal limits  URINALYSIS, ROUTINE W REFLEX MICROSCOPIC - Abnormal; Notable for the following components:   Color, Urine YELLOW (*)    APPearance CLOUDY (*)    Hgb urine dipstick LARGE (*)    Protein, ur 100 (*)    Bacteria, UA RARE (*)    All other components within normal limits  POC URINE PREG, ED  CBG MONITORING, ED  TYPE AND SCREEN     EKG  Sinus rhythm with a rate of 69.  Short PR interval and nonspecific T wave abnormality inferior and lateral leads.   RADIOLOGY  Image and radiology report reviewed and interpreted by me. Radiology report consistent with the same.  Right ovarian cyst on pelvic ultrasound  PROCEDURES:  Critical Care performed: No  Procedures   MEDICATIONS ORDERED IN ED:  Medications  sodium chloride 0.9 % bolus 1,000 mL (0 mLs Intravenous Stopped 05/08/22 2033)     IMPRESSION / MDM / ASSESSMENT AND PLAN / ED COURSE  I have reviewed the triage note.  Differential diagnosis includes, but is not limited to, abnormal vaginal bleeding, fibroids, ovarian cyst, miscarriage.  Patient's presentation is most consistent with acute complicated illness / injury requiring diagnostic workup.  47 year old female presenting to the emergency department due to dizziness and vaginal bleeding.  See HPI for further details.  Clinical Course as of 05/08/22 2358  Fri May 08, 2022  1724 Lab studies are overall reassuring.  No indication of anemia.  Urinalysis shows some rare bacteria, protein, and large amount of hemoglobin with 6-10 white blood cells and 11-20 squamous epithelial cells which is all likely related to menses.  Pregnancy test is negative.  Orthostatic vital signs are reassuring.  Patient observed ambulating with the steady, unassisted gait.  Plan will be to get pelvic ultrasound to evaluate for abnormal vaginal bleeding. [CT]    Clinical Course User Index [CT] Nayef [employer] B, FNP   Pelvic ultrasound is reassuring.  She  does have a small cyst on the right ovary but otherwise no acute concerns. She was instructed to follow up with her gynecologist.for vaginal bleeding and primary care if dizziness does not resolve or becomes recurrent.    FINAL CLINICAL IMPRESSION(S) / ED DIAGNOSES   Final diagnoses:  Dizziness  Vaginal bleeding  Cyst of right ovary     Rx / DC Orders   ED Discharge Orders     None        Note:  This document was prepared using Dragon voice recognition software and may include unintentional dictation errors.   Victorino Dike, FNP 05/08/22 2358    Carrie Mew, MD 05/19/22 [phone]

## 2022-05-26 ENCOUNTER — Other Ambulatory Visit: Payer: Self-pay | Admitting: Family Medicine

## 2022-05-26 DIAGNOSIS — R519 Headache, unspecified: Secondary | ICD-10-CM

## 2022-05-26 DIAGNOSIS — R42 Dizziness and giddiness: Secondary | ICD-10-CM

## 2022-05-29 ENCOUNTER — Ambulatory Visit: Payer: No Typology Code available for payment source

## 2023-06-26 IMAGING — CT CT ABD-PELV W/ CM
2 of 5 series · 15 of 46 positions shown, 17 images · IV contrast (agent unspecified)
Comparison: 12/04/2016

CLINICAL DATA: Nausea, constipation, lower abdominal pain

EXAM:
CT ABDOMEN AND PELVIS WITH CONTRAST
TECHNIQUE: Multidetector CT imaging of the abdomen and pelvis was performed
using the standard protocol following bolus administration of
intravenous contrast.

[Series 3: abd pelvis 5.00 · axial · 0.60mm/px · z∈[-1535,-1160]mm · 12 of 85 slices shown, 14 images]
[im 5/85  soft-tissue]
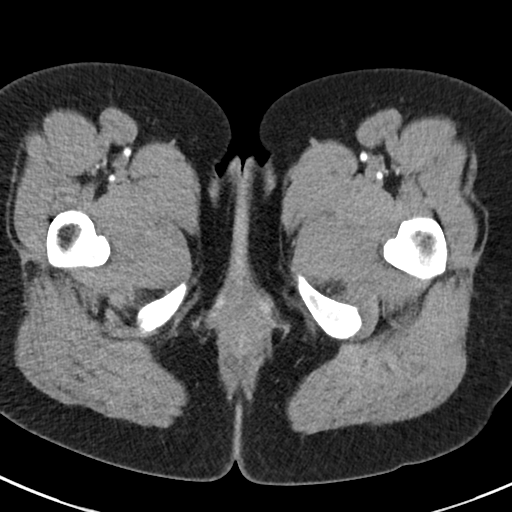
[im 5/85  bone]
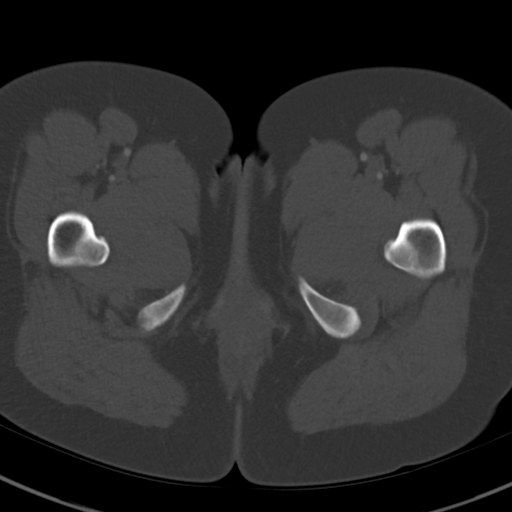
[im 15/85  soft-tissue]
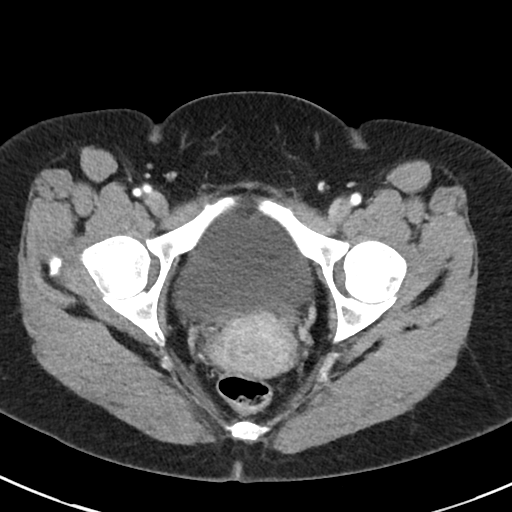
[im 20/85  soft-tissue]
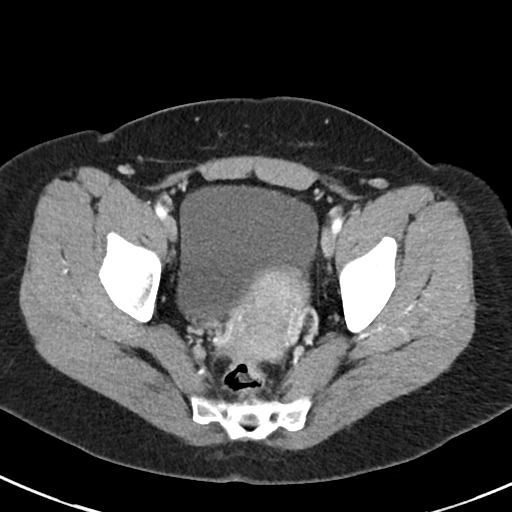
[im 25/85  soft-tissue]
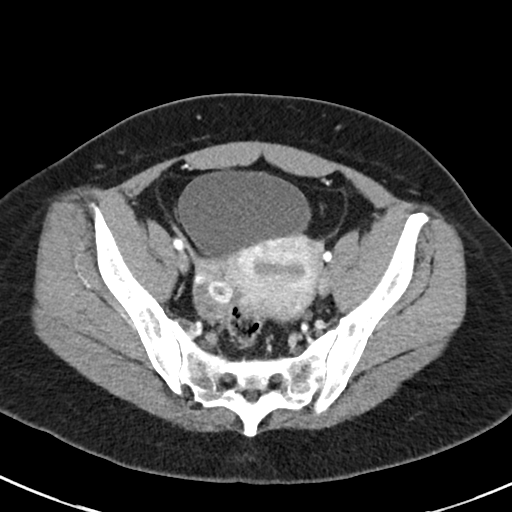
[im 35/85  soft-tissue]
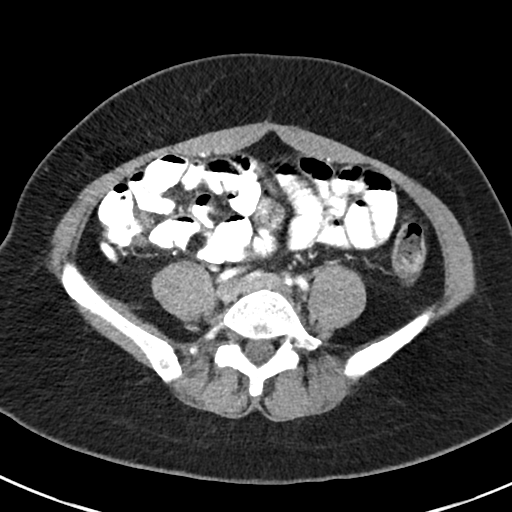
[im 40/85  soft-tissue]
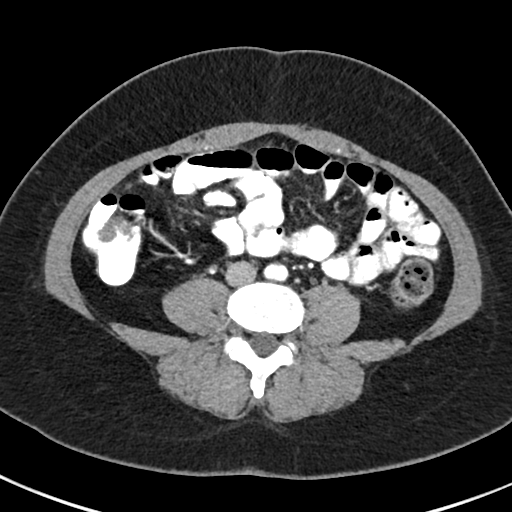
[im 45/85  soft-tissue]
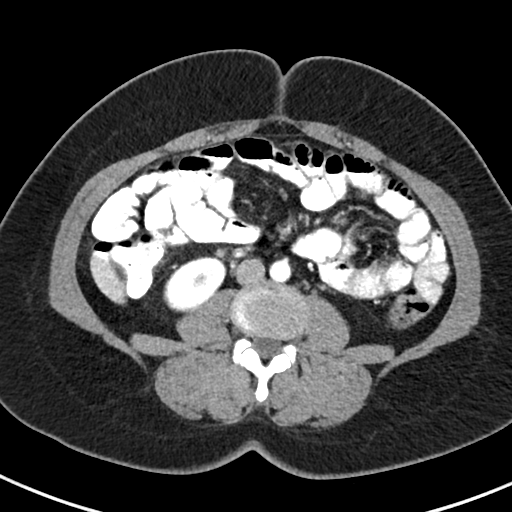
[im 55/85  soft-tissue]
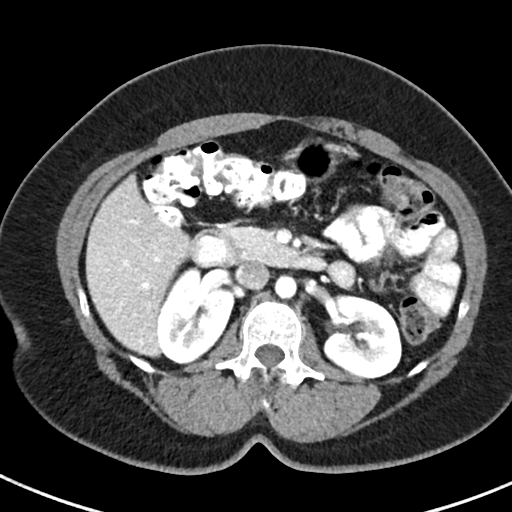
[im 60/85  soft-tissue]
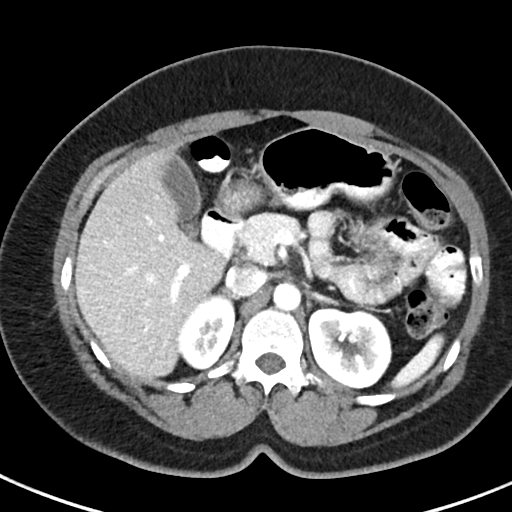
[im 60/85  bone]
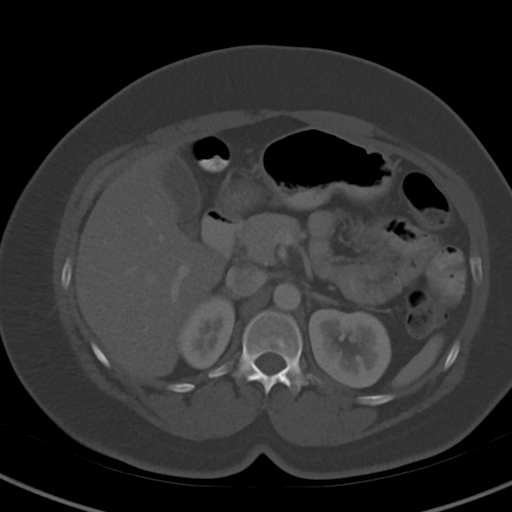
[im 65/85  soft-tissue]
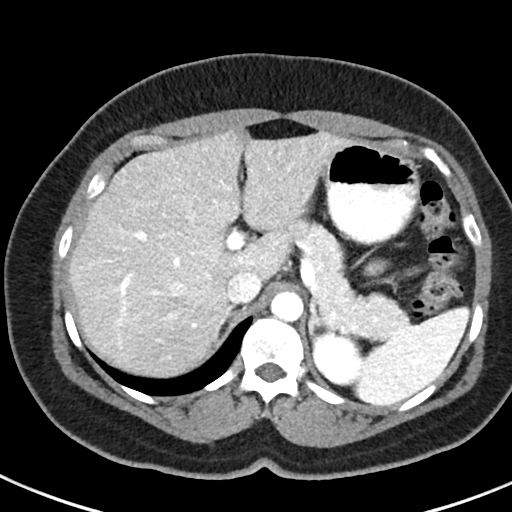
[im 75/85  soft-tissue]
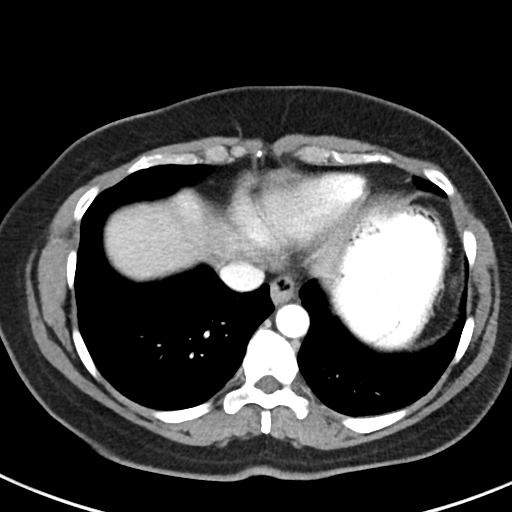
[im 80/85  soft-tissue]
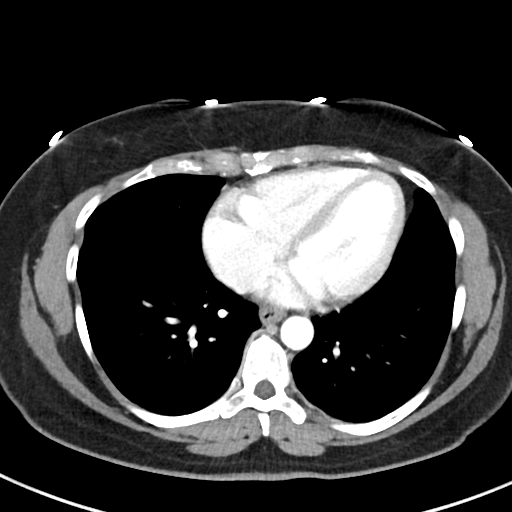

[Series 5: coronals abd pelvis 2.00 cor · coronal · 0.60mm/px · 3 of 131 slices shown]
[im 44/131  soft-tissue]
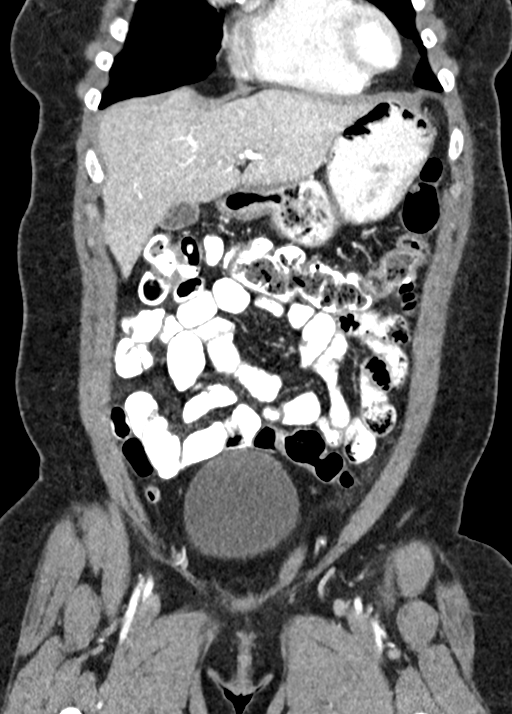
[im 58/131  soft-tissue]
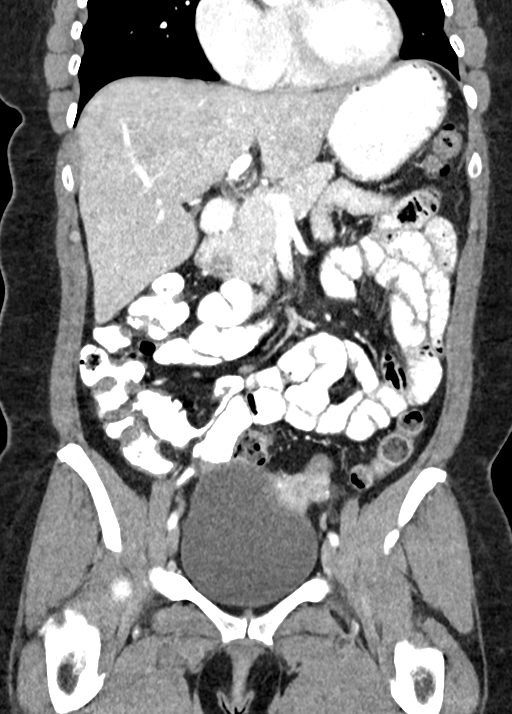
[im 73/131  soft-tissue]
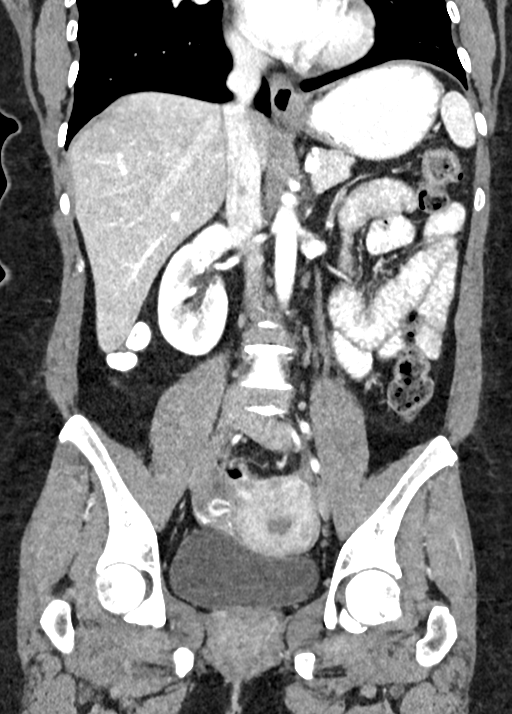

[15 of 46 positions shown; findings below may reference images not displayed]

RADIATION DOSE REDUCTION: This exam was performed according to the
departmental dose-optimization program which includes automated
exposure control, adjustment of the mA and/or kV according to
patient size and/or use of iterative reconstruction technique.

CONTRAST:  100mL OMNIPAQUE IOHEXOL 300 MG/ML  SOLN
FINDINGS: Lower chest: Included lung bases are clear.  Heart size is normal.

Hepatobiliary: Subcentimeter low-density lesion within the right
hepatic lobe, stable from 8153 and benign. Liver appears otherwise
unremarkable. Gallbladder within normal limits. No hyperdense
gallstone. No biliary dilatation.

Pancreas: Unremarkable. No pancreatic ductal dilatation or
surrounding inflammatory changes.

Spleen: Normal in size without focal abnormality.

Adrenals/Urinary Tract: Unremarkable adrenal glands. Kidneys enhance
symmetrically without focal lesion, stone, or hydronephrosis.
Ureters are nondilated. Urinary bladder appears unremarkable.

Stomach/Bowel: Oral contrast is present throughout the stomach and
bowel to the level of the distal transverse colon. Stomach within
normal limits. No dilated loops of bowel. Contrast filled,
noninflamed appendix in the right lower quadrant. No focal colonic
wall thickening or inflammatory changes.

Vascular/Lymphatic: No significant vascular findings are present. No
enlarged abdominal or pelvic lymph nodes.

Reproductive: Partial bicornuate appearance of the uterus is again
noted. No significant adnexal abnormality. Corpus luteum cyst or
involuting follicle in the right ovary incidentally noted.

Other: No free fluid. No abdominopelvic fluid collection. No
pneumoperitoneum. No abdominal wall hernia.

Musculoskeletal: No acute or significant osseous findings.
IMPRESSION: No acute abdominopelvic findings.
